# Patient Record
Sex: Female | Born: 1985 | Race: Black or African American | Hispanic: Yes | Marital: Single | Smoking: Current every day smoker
Health system: Southern US, Community
[De-identification: ages and names within clinical notes are randomized; demographics above are authoritative.]

## PROBLEM LIST (undated history)

## (undated) DIAGNOSIS — R569 Unspecified convulsions: Secondary | ICD-10-CM

## (undated) DIAGNOSIS — F4310 Post-traumatic stress disorder, unspecified: Secondary | ICD-10-CM

## (undated) DIAGNOSIS — F410 Panic disorder [episodic paroxysmal anxiety] without agoraphobia: Secondary | ICD-10-CM

## (undated) DIAGNOSIS — G40909 Epilepsy, unspecified, not intractable, without status epilepticus: Secondary | ICD-10-CM

## (undated) HISTORY — DX: Panic disorder (episodic paroxysmal anxiety): F41.0

## (undated) HISTORY — PX: OTHER SURGICAL HISTORY: SHX169

## (undated) HISTORY — PX: BIOPSY THYROID: PRO38

## (undated) HISTORY — DX: Post-traumatic stress disorder, unspecified: F43.10

---
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
## 2007-01-21 ENCOUNTER — Inpatient Hospital Stay (HOSPITAL_COMMUNITY): Admission: AD | Admit: 2007-01-21 | Discharge: 2007-01-23 | Payer: Self-pay | Admitting: Obstetrics

## 2007-12-21 ENCOUNTER — Emergency Department (HOSPITAL_COMMUNITY): Admission: EM | Admit: 2007-12-21 | Discharge: 2007-12-21 | Payer: Self-pay | Admitting: Emergency Medicine

## 2008-07-14 ENCOUNTER — Emergency Department (HOSPITAL_COMMUNITY): Admission: EM | Admit: 2008-07-14 | Discharge: 2008-07-15 | Payer: Self-pay | Admitting: Emergency Medicine

## 2008-12-23 ENCOUNTER — Emergency Department (HOSPITAL_COMMUNITY): Admission: EM | Admit: 2008-12-23 | Discharge: 2008-12-23 | Payer: Self-pay | Admitting: Emergency Medicine

## 2010-12-21 NOTE — H&P (Signed)
NAMEMATTEA, SEGER NO.:  [id number]   MEDICAL RECORD NO.:  [id number]          PATIENT TYPE:  INP   LOCATION:  9163                          FACILITY:  [facility]   PHYSICIAN:  Roseanna Rainbow, M.D.DATE OF BIRTH:  January 07, 1986   DATE OF ADMISSION:  01/21/2007  DATE OF DISCHARGE:                              HISTORY & PHYSICAL   CHIEF COMPLAINT:  The patient is an 25 year old gravida 1, para 0 with  an estimated date of confinement of June 23 with an intrauterine  pregnancy at 38+ weeks, complaining of rupture of membranes and uterine  contractions.   HISTORY OF PRESENT ILLNESS:  Please see the above.   ALLERGIES:  No known drug allergies.   PAST GYNECOLOGICAL HISTORY:  Noncontributory.   PAST MEDICAL HISTORY:  Denies.   PAST SURGICAL HISTORY:  Denies.   FAMILY HISTORY:  Noncontributory.   SOCIAL HISTORY:  No tobacco, ethanol or drug use.   OBSTETRIC RISK FACTORS:  Adolescent prenatal screen:  Hemoglobin 12.8,  hematocrit 37.8, platelets 221,000.  Blood type O positive, antibody  screen negative, sickle cell trait negative, RPR nonreactive, rubella  immune, hepatitis B surface antigen negative, HIV nonreactive, PPD  declined, Pap test abnormal.  Gonorrhea DNA probe positive.  Tests of  cure negative.  Addendum to OB risk factors:  Gonorrhea positive GC DNA  probe, chlamydia negative, quad screen negative, one hour GCT 82, GBS  negative on May 22, ultrasound on December 21 was 13 weeks 6 days.  Ultrasound February 5 at 20 weeks one day, placenta anterior.   PHYSICAL EXAMINATION:  VITAL SIGNS:  Temperature 97.9, blood pressure  107/69, pulse 70, respirations 20, fetal heart tracing reassuring,  tocodynamometer uterine contractions every 2-5 minutes, sterile vaginal  exam per the RN, gross rupture.  The cervix is 2 cm dilated, 80%  effaced.   ASSESSMENT:  Primigravida at term, early labor, fetal heart tracing  consistent with fetal well being.   PLAN:   Admission, expectant management.     Roseanna Rainbow, M.D.  Electronically Signed    LAJ/MEDQ  D:  01/21/2007  T:  01/21/2007  Job:  [id number]

## 2011-05-04 LAB — URINE MICROSCOPIC-ADD ON

## 2011-05-04 LAB — URINALYSIS, ROUTINE W REFLEX MICROSCOPIC
Bilirubin Urine: NEGATIVE
Glucose, UA: NEGATIVE
Ketones, ur: NEGATIVE
Nitrite: NEGATIVE
pH: 6

## 2011-05-04 LAB — POCT PREGNANCY, URINE: Operator id: 161631

## 2011-05-13 LAB — URINALYSIS, ROUTINE W REFLEX MICROSCOPIC
Nitrite: NEGATIVE
Protein, ur: NEGATIVE mg/dL
Urobilinogen, UA: 0.2 mg/dL (ref 0.0–1.0)

## 2011-05-13 LAB — URINE CULTURE

## 2011-05-13 LAB — URINE MICROSCOPIC-ADD ON

## 2011-05-13 LAB — POCT PREGNANCY, URINE: Preg Test, Ur: NEGATIVE

## 2011-05-13 LAB — WET PREP, GENITAL: Trich, Wet Prep: NONE SEEN

## 2011-05-25 LAB — CBC
HCT: 37.8
Hemoglobin: 10.8 — ABNORMAL LOW
Hemoglobin: 12.8
MCHC: 34
MCV: 92
MCV: 92.5
Platelets: 245
RDW: 13.3
RDW: 13.3

## 2014-11-12 ENCOUNTER — Encounter: Payer: Self-pay | Admitting: *Deleted

## 2014-12-11 ENCOUNTER — Encounter: Payer: Self-pay | Admitting: Medical

## 2017-06-06 ENCOUNTER — Encounter (HOSPITAL_COMMUNITY): Payer: Self-pay | Admitting: *Deleted

## 2017-06-06 ENCOUNTER — Emergency Department (HOSPITAL_COMMUNITY)
Admission: EM | Admit: 2017-06-06 | Discharge: 2017-06-07 | Disposition: A | Discharge status: Home / Self Care | Payer: Medicaid Other | Attending: Physician Assistant | Admitting: Physician Assistant

## 2017-06-06 DIAGNOSIS — J029 Acute pharyngitis, unspecified: Secondary | ICD-10-CM

## 2017-06-06 DIAGNOSIS — B349 Viral infection, unspecified: Secondary | ICD-10-CM | POA: Insufficient documentation

## 2017-06-06 LAB — URINALYSIS, ROUTINE W REFLEX MICROSCOPIC
Glucose, UA: NEGATIVE mg/dL
Hgb urine dipstick: NEGATIVE
KETONES UR: 15 mg/dL — AB
LEUKOCYTES UA: NEGATIVE
NITRITE: NEGATIVE
PH: 6 (ref 5.0–8.0)
PROTEIN: 100 mg/dL — AB
Specific Gravity, Urine: 1.025 (ref 1.005–1.030)

## 2017-06-06 LAB — URINALYSIS, MICROSCOPIC (REFLEX): RBC / HPF: NONE SEEN RBC/hpf (ref 0–5)

## 2017-06-06 LAB — COMPREHENSIVE METABOLIC PANEL
ALT: 19 U/L (ref 14–54)
ANION GAP: 13 (ref 5–15)
AST: 22 U/L (ref 15–41)
Albumin: 4.3 g/dL (ref 3.5–5.0)
Alkaline Phosphatase: 77 U/L (ref 38–126)
BUN: 9 mg/dL (ref 6–20)
CALCIUM: 9.6 mg/dL (ref 8.9–10.3)
CO2: 19 mmol/L — ABNORMAL LOW (ref 22–32)
Chloride: 101 mmol/L (ref 101–111)
Creatinine, Ser: 0.91 mg/dL (ref 0.44–1.00)
GFR calc non Af Amer: >60 mL/min (ref >60)
Glucose, Bld: 97 mg/dL (ref 65–99)
POTASSIUM: 3.4 mmol/L — AB (ref 3.5–5.1)
SODIUM: 133 mmol/L — AB (ref 135–145)
TOTAL PROTEIN: 7.8 g/dL (ref 6.5–8.1)
Total Bilirubin: 1 mg/dL (ref 0.3–1.2)

## 2017-06-06 LAB — CBC
HCT: 43.3 % (ref 36.0–46.0)
Hemoglobin: 15.2 g/dL — ABNORMAL HIGH (ref 12.0–15.0)
MCH: 32.9 pg (ref 26.0–34.0)
MCHC: 35.1 g/dL (ref 30.0–36.0)
MCV: 93.7 fL (ref 78.0–100.0)
PLATELETS: 235 10*3/uL (ref 150–400)
RBC: 4.62 MIL/uL (ref 3.87–5.11)
RDW: 12.5 % (ref 11.5–15.5)
WBC: 32.7 10*3/uL — AB (ref 4.0–10.5)

## 2017-06-06 LAB — LIPASE, BLOOD: Lipase: 17 U/L (ref 11–51)

## 2017-06-06 LAB — I-STAT CG4 LACTIC ACID, ED
Lactic Acid, Venous: 1.38 mmol/L (ref 0.5–1.9)
Lactic Acid, Venous: 0.78 mmol/L (ref 0.5–1.9)

## 2017-06-06 LAB — RAPID STREP SCREEN (MED CTR MEBANE ONLY): Streptococcus, Group A Screen (Direct): NEGATIVE

## 2017-06-06 LAB — MONONUCLEOSIS SCREEN: Mono Screen: NEGATIVE

## 2017-06-06 MED ORDER — SODIUM CHLORIDE 0.9 % IV BOLUS (SEPSIS)
1000.0000 mL | Freq: Once | INTRAVENOUS | Status: AC
Start: 2017-06-06 — End: 2017-06-07
  Administered 2017-06-06: 1000 mL via INTRAVENOUS

## 2017-06-06 MED ORDER — ONDANSETRON 4 MG PO TBDP
4.0000 mg | ORAL_TABLET | Freq: Once | ORAL | Status: DC
Start: 2017-06-06 — End: 2017-06-07

## 2017-06-06 MED ORDER — ONDANSETRON 4 MG PO TBDP
ORAL_TABLET | ORAL | Status: AC
  Filled 2017-06-06: qty 1

## 2017-06-06 MED ORDER — POTASSIUM CHLORIDE CRYS ER 20 MEQ PO TBCR
20.0000 meq | EXTENDED_RELEASE_TABLET | Freq: Once | ORAL | Status: AC
Start: 2017-06-06 — End: 2017-06-06
  Administered 2017-06-06: 20 meq via ORAL
  Filled 2017-06-06: qty 1

## 2017-06-06 MED ORDER — DEXAMETHASONE SODIUM PHOSPHATE 10 MG/ML IJ SOLN
10.0000 mg | Freq: Once | INTRAMUSCULAR | Status: AC
  Administered 2017-06-06: 10 mg via INTRAVENOUS
  Filled 2017-06-06: qty 1

## 2017-06-06 MED ORDER — KETOROLAC TROMETHAMINE 30 MG/ML IJ SOLN
30.0000 mg | Freq: Once | INTRAMUSCULAR | Status: AC
  Administered 2017-06-06: 30 mg via INTRAVENOUS
  Filled 2017-06-06: qty 1

## 2017-06-06 MED ORDER — ACETAMINOPHEN 325 MG PO TABS
650.0000 mg | ORAL_TABLET | Freq: Once | ORAL | Status: AC
  Administered 2017-06-06: 650 mg via ORAL
  Filled 2017-06-06: qty 2

## 2017-06-06 NOTE — ED Triage Notes (Signed)
Pt c/o sore throat onset x 3 days ago with nausea & diarrhea, pt c/o generalized body aches, pt c/o Headache & chills, pt c/o emesis with constant vomiting

## 2017-06-06 NOTE — ED Provider Notes (Signed)
[facility] EMERGENCY DEPARTMENT Provider Note   CSN: [id number] Arrival date & time: 06/06/17  1554     History   Chief Complaint Chief Complaint  Patient presents with  . Emesis  . Sore Throat    HPI Barbara Gilbert is a 31 y.o. female.  HPI   Barbara Gilbert is a 31 y.o. female, patient with no pertinent past medical history, presenting to the ED with sore throat accompanied by nausea, nonbilious, nonbloody vomiting, diarrhea, chills, and body aches for the last 3 days.  Rates her throat pain 10/10, sharp, nonradiating. Admits to poor oral intake.  Denies shortness of breath, chest pain, cough, abdominal pain, hematochezia/melena, or any other complaints.  History reviewed. No pertinent past medical history.  There are no active problems to display for this patient.   History reviewed. No pertinent surgical history.  OB History    No data available       Home Medications    Prior to Admission medications   Medication Sig Start Date End Date Taking? Authorizing Provider  ibuprofen (ADVIL,MOTRIN) 600 MG tablet Take 1 tablet (600 mg total) by mouth every 6 (six) hours as needed. 06/07/17   Joy, Shawn C, PA-C  ondansetron (ZOFRAN ODT) 4 MG disintegrating tablet Take 1 tablet (4 mg total) by mouth every 8 (eight) hours as needed for nausea or vomiting. 06/07/17   Anselm Pancoast, PA-C    Family History No family history on file.  Social History Social History  Substance Use Topics  . Smoking status: Never Smoker  . Smokeless tobacco: Never Used  . Alcohol use No     Allergies   Patient has no known allergies.   Review of Systems Review of Systems  Constitutional: Positive for chills.  HENT: Positive for congestion and sore throat. Negative for facial swelling and voice change.   Eyes: Negative for visual disturbance.  Respiratory: Positive for cough. Negative for shortness of breath.   Cardiovascular: Negative for chest pain.  Gastrointestinal:  Positive for diarrhea, nausea and vomiting. Negative for abdominal pain.  Musculoskeletal: Positive for myalgias. Negative for back pain.  Neurological: Negative for weakness and numbness.  All other systems reviewed and are negative.    Physical Exam Updated Vital Signs BP 100/83 (BP Location: Right Arm)   Pulse 94   Temp 97.9 F (36.6 C) (Oral)   Resp (!) 22   Ht 5\' 3"  (1.6 m)   Wt 54.4 kg (120 lb)   LMP 05/17/2017 (Approximate)   SpO2 100%   BMI 21.26 kg/m   Physical Exam  Constitutional: She appears well-developed and well-nourished. No distress.  HENT:  Head: Normocephalic and atraumatic.  Mouth/Throat: Uvula is midline and mucous membranes are normal. Oropharyngeal exudate, posterior oropharyngeal edema and posterior oropharyngeal erythema present.  Handles oral secretions without difficulty.  Mouth opening to least 3 finger widths.  Eyes: Conjunctivae are normal.  Neck: Normal range of motion. Neck supple. No Brudzinski's sign and no Kernig's sign noted.  Cardiovascular: Normal rate, regular rhythm, normal heart sounds and intact distal pulses.   Pulmonary/Chest: Effort normal and breath sounds normal. No respiratory distress.  Abdominal: Soft. There is no tenderness. There is no guarding.  Musculoskeletal: She exhibits no edema.  Lymphadenopathy:    She has cervical adenopathy.  Neurological: She is alert.  Skin: Skin is warm and dry. Capillary refill takes less than 2 seconds. She is not diaphoretic.  Psychiatric: She has a normal mood and affect. Her behavior is  normal.  Nursing note and vitals reviewed.    ED Treatments / Results  Labs (all labs ordered are listed, but only abnormal results are displayed) Labs Reviewed  COMPREHENSIVE METABOLIC PANEL - Abnormal; Notable for the following:       Result Value   Sodium 133 (*)    Potassium 3.4 (*)    CO2 19 (*)    All other components within normal limits  CBC - Abnormal; Notable for the following:    WBC  32.7 (*)    Hemoglobin 15.2 (*)    All other components within normal limits  URINALYSIS, ROUTINE W REFLEX MICROSCOPIC - Abnormal; Notable for the following:    APPearance HAZY (*)    Bilirubin Urine SMALL (*)    Ketones, ur 15 (*)    Protein, ur 100 (*)    All other components within normal limits  URINALYSIS, MICROSCOPIC (REFLEX) - Abnormal; Notable for the following:    Bacteria, UA MANY (*)    Squamous Epithelial / LPF 0-5 (*)    All other components within normal limits  RAPID STREP SCREEN (NOT AT [facility])  CULTURE, GROUP A STREP ([facility])  LIPASE, BLOOD  MONONUCLEOSIS SCREEN  INFLUENZA PANEL BY PCR (TYPE A & B)  I-STAT CG4 LACTIC ACID, ED  I-STAT CG4 LACTIC ACID, ED    EKG  EKG Interpretation  Date/Time:  Tuesday June 06 2017 21:14:27 EDT Ventricular Rate:  77 PR Interval:    QRS Duration: 76 QT Interval:  382 QTC Calculation: 433 R Axis:   88 Text Interpretation:  Sinus rhythm Minimal ST depression, inferior leads Confirmed by Donnetta Hutchingook, Brian ([id number]) on 06/07/2017 9:03:56 PM       Radiology No results found.  Procedures Procedures (including critical care time)  Medications Ordered in ED Medications  sodium chloride 0.9 % bolus 1,000 mL (0 mLs Intravenous Stopped 06/07/17 0000)  ketorolac (TORADOL) 30 MG/ML injection 30 mg (30 mg Intravenous Given 06/06/17 2121)  dexamethasone (DECADRON) injection 10 mg (10 mg Intravenous Given 06/06/17 2121)  acetaminophen (TYLENOL) tablet 650 mg (650 mg Oral Given 06/06/17 2135)  potassium chloride SA (K-DUR,KLOR-CON) CR tablet 20 mEq (20 mEq Oral Given 06/06/17 2303)     Initial Impression / Assessment and Plan / ED Course  I have reviewed the triage vital signs and the nursing notes.  Pertinent labs & imaging results that were available during my care of the patient were reviewed by me and considered in my medical decision making (see chart for details).  Clinical Course as of Jun 08 218  Tue Jun 06, 2017  2245 Patient  states she feels much better. Throat pain is now minor.   [SJ]    Clinical Course User Index [SJ] Joy, Shawn C, PA-C    Patient presents with sore throat, nausea/vomiting, diarrhea, and body aches.  Symptoms consistent with viral syndrome.  Significant leukocytosis noted and mononucleosis is high on the differential.  Negative Monospot may be due to patient being early in the course. Patient is nontoxic appearing, not tachycardic or tachypneic on my exam, not hypotensive, maintains SPO2 of 100% on room air, and is in no apparent distress. Patient has no signs of sepsis or other serious or life-threatening condition. The patient was given instructions for home care as well as return precautions. Patient voices understanding of these instructions, accepts the plan, and is comfortable with discharge.   Findings and plan of care discussed with Bary Castillaourteney Mackuen, MD.   Vitals:   06/06/17 1949 06/06/17  2117 06/06/17 2243 06/07/17 0026  BP: 100/83   119/74  Pulse: 94   (!) 102  Resp: (!) 22   20  Temp:  (!) 101.6 F (38.7 C) 98.9 F (37.2 C)   TempSrc:  Rectal Oral   SpO2: 100%   100%  Weight:      Height:         Final Clinical Impressions(s) / ED Diagnoses   Final diagnoses:  Sore throat  Viral syndrome    New Prescriptions Discharge Medication List as of 06/07/2017 12:05 AM    START taking these medications   Details  ibuprofen (ADVIL,MOTRIN) 600 MG tablet Take 1 tablet (600 mg total) by mouth every 6 (six) hours as needed., Starting Wed 06/07/2017, Print    ondansetron (ZOFRAN ODT) 4 MG disintegrating tablet Take 1 tablet (4 mg total) by mouth every 8 (eight) hours as needed for nausea or vomiting., Starting Wed 06/07/2017, Print         Joy, Weston C, PA-C 06/08/17 0223    Abelino Derrick, MD 06/08/17 1745

## 2017-06-07 LAB — INFLUENZA PANEL BY PCR (TYPE A & B)
INFLAPCR: NEGATIVE
Influenza B By PCR: NEGATIVE

## 2017-06-07 MED ORDER — ONDANSETRON 4 MG PO TBDP: 4.0000 mg | ORAL_TABLET | Freq: Three times a day (TID) | ORAL | 0 refills | Status: DC | PRN

## 2017-06-07 MED ORDER — IBUPROFEN 600 MG PO TABS: 600.0000 mg | ORAL_TABLET | Freq: Four times a day (QID) | ORAL | 0 refills | Status: DC | PRN

## 2017-06-07 NOTE — Discharge Instructions (Signed)
Your symptoms are consistent with a viral illness. Viruses do not require antibiotics. Treatment is symptomatic care and it is important to note that these symptoms may last for 7-14 days.  Your strep test was negative. Your mono test was also negative, however, there is a chance of a false negative. Mono can cause swelling to the spleen which is in the left side of the abdomen.  Please refrain from any contact sports or activities that could cause trauma or hits to the abdomen.  Hand washing: Wash your hands throughout the day, but especially before and after touching the face, using the restroom, sneezing, coughing, or touching surfaces that have been coughed or sneezed upon. Hydration: Symptoms will be intensified and complicated by dehydration. Dehydration can also extend the duration of symptoms. Drink plenty of fluids and get plenty of rest. You should be drinking at least half a liter of water an hour to stay hydrated. Electrolyte drinks are also encouraged. You should be drinking enough fluids to make your urine light yellow, almost clear. If this is not the case, you are not drinking enough water. Please note that some of the treatments indicated below will not be effective if you are not adequately hydrated. Pain or fever: Ibuprofen, Naproxen, or Tylenol for pain or fever.  Nausea/vomiting: Use the Zofran for nausea or vomiting.  Congestion: Plain Mucinex may help relieve congestion. Saline sinus rinses and saline nasal sprays may also help relieve congestion. If you do not have heart problems or an allergy to such medications, you may also try phenylephrine or Sudafed. Sore throat: Warm liquids or Chloraseptic spray may help soothe a sore throat. Gargle twice a day with a salt water solution made from a half teaspoon of salt in a cup of warm water.  Follow up: Follow up with a primary care provider, as needed, for any future management of this issue. Return: Return to the ED for worsening  symptoms, continuous vomiting, severe abdominal pain, or any other major concerns.

## 2017-06-09 LAB — CULTURE, GROUP A STREP (THRC)

## 2017-06-22 ENCOUNTER — Ambulatory Visit: Payer: Self-pay | Admitting: Obstetrics and Gynecology

## 2017-07-03 ENCOUNTER — Ambulatory Visit (INDEPENDENT_AMBULATORY_CARE_PROVIDER_SITE_OTHER): Payer: Medicaid Other | Admitting: Obstetrics

## 2017-07-03 ENCOUNTER — Other Ambulatory Visit (HOSPITAL_COMMUNITY)
Admission: RE | Admit: 2017-07-03 | Discharge: 2017-07-03 | Disposition: A | Discharge status: Home / Self Care | Payer: Medicaid Other | Source: Ambulatory Visit | Attending: Obstetrics | Admitting: Obstetrics

## 2017-07-03 ENCOUNTER — Encounter: Payer: Self-pay | Admitting: Obstetrics

## 2017-07-03 VITALS — BP 110/67 | HR 79 | Ht 63.0 in | Wt 120.8 lb

## 2017-07-03 DIAGNOSIS — N898 Other specified noninflammatory disorders of vagina: Secondary | ICD-10-CM | POA: Diagnosis not present

## 2017-07-03 DIAGNOSIS — Z01419 Encounter for gynecological examination (general) (routine) without abnormal findings: Secondary | ICD-10-CM | POA: Diagnosis not present

## 2017-07-03 DIAGNOSIS — Z113 Encounter for screening for infections with a predominantly sexual mode of transmission: Secondary | ICD-10-CM

## 2017-07-03 DIAGNOSIS — Z3009 Encounter for other general counseling and advice on contraception: Secondary | ICD-10-CM

## 2017-07-03 DIAGNOSIS — Z0000 Encounter for general adult medical examination without abnormal findings: Secondary | ICD-10-CM | POA: Diagnosis not present

## 2017-07-03 NOTE — Progress Notes (Signed)
Subjective:        Barbara Gilbert is a 31 y.o. female here for a routine exam.  Current complaints: None.    Personal health questionnaire:  Is patient Barbara Gilbert, have a family history of breast and/or ovarian cancer: no Is there a family history of uterine cancer diagnosed at age < 2950, gastrointestinal cancer, urinary tract cancer, family member who is a [name]: no Is the patient overweight and hypertensive, family history of diabetes, personal history of gestational diabetes, preeclampsia or PCOS: no Is patient over 6655, have PCOS,  family history of premature CHD under age 29, diabetes, smoke, have hypertension or peripheral artery disease:  no At any time, has a partner hit, kicked or otherwise hurt or frightened you?: no Over the past 2 weeks, have you felt down, depressed or hopeless?: no Over the past 2 weeks, have you felt little interest or pleasure in doing things?:no   Gynecologic History Patient's last menstrual period was 06/09/2017. Contraception: none Last Pap: 2016. Results were: normal Last mammogram: n/a. Results were: n/a  Obstetric History OB History  Gravida Para Term Preterm AB Living  3 1 1   2 1   SAB TAB Ectopic Multiple Live Births    2     1    # Outcome Date GA Lbr Len/2nd Weight Sex Delivery Anes PTL Lv  3 Term 01/21/07    M Vag-Spont   LIV  2 TAB           1 TAB               History reviewed. No pertinent past medical history.  History reviewed. No pertinent surgical history.   Current Outpatient Medications:  .  clonazePAM (KLONOPIN) 1 MG tablet, Take 1 mg by mouth 2 (two) times daily as needed for anxiety., Disp: , Rfl:  .  ibuprofen (ADVIL,MOTRIN) 600 MG tablet, Take 1 tablet (600 mg total) by mouth every 6 (six) hours as needed. (Patient not taking: Reported on 07/03/2017), Disp: 30 tablet, Rfl: 0 .  ondansetron (ZOFRAN ODT) 4 MG disintegrating tablet, Take 1 tablet (4 mg total) by mouth every 8 (eight) hours  as needed for nausea or vomiting. (Patient not taking: Reported on 07/03/2017), Disp: 20 tablet, Rfl: 0 No Known Allergies  Social History   Tobacco Use  . Smoking status: Current Every Day Smoker    Packs/day: 0.30    Start date: 2009  . Smokeless tobacco: Never Used  Substance Use Topics  . Alcohol use: No    History reviewed. No pertinent family history.    Review of Systems  Constitutional: negative for fatigue and weight loss Respiratory: negative for cough and wheezing Cardiovascular: negative for chest pain, fatigue and palpitations Gastrointestinal: negative for abdominal pain and change in bowel habits Musculoskeletal:negative for myalgias Neurological: negative for gait problems and tremors Behavioral/Psych: negative for abusive relationship, depression Endocrine: negative for temperature intolerance    Genitourinary:negative for abnormal menstrual periods, genital lesions, hot flashes, sexual problems and vaginal discharge Integument/breast: negative for breast lump, breast tenderness, nipple discharge and skin lesion(s)    Objective:       BP 110/67   Pulse 79   Ht 5\' 3"  (1.6 m)   Wt 120 lb 12.8 oz (54.8 kg)   LMP 06/09/2017   BMI 21.40 kg/m  General:   alert  Skin:   no rash or abnormalities  Lungs:   clear to auscultation bilaterally  Heart:   regular rate  and rhythm, S1, S2 normal, no murmur, click, rub or gallop  Breasts:   normal without suspicious masses, skin or nipple changes or axillary nodes  Abdomen:  normal findings: no organomegaly, soft, non-tender and no hernia  Pelvis:  External genitalia: normal general appearance Urinary system: urethral meatus normal and bladder without fullness, nontender Vaginal: normal without tenderness, induration or masses Cervix: normal appearance Adnexa: normal bimanual exam Uterus: anteverted and non-tender, normal size   Lab Review Urine pregnancy test Labs reviewed yes Radiologic studies reviewed  no  50% of 20 min visit spent on counseling and coordination of care.    Assessment and Plan:     1. Encounter for routine gynecological examination with Papanicolaou smear of cervix Rx: - Cytology - PAP  2. Vaginal discharge Rx: - Cervicovaginal ancillary only  3. Encounter for other general counseling and advice on contraception - wants Laparoscopic Tubal - tubal papers signed today  4. Screening examination for STD (sexually transmitted disease) Rx: - HIV antibody - Hepatitis B surface antigen - RPR - Hepatitis C antibody   Plan:    Education reviewed: calcium supplements, depression evaluation, low fat, low cholesterol diet, safe sex/STD prevention, self breast exams, smoking cessation and weight bearing exercise. Contraception: tubal ligation. Follow up in: 4 weeks. Pre-op visit for tubal.  No orders of the defined types were placed in this encounter.  No orders of the defined types were placed in this encounter.

## 2017-07-03 NOTE — Progress Notes (Signed)
Patient is in the office as new GYN, wants to discuss tubal and requests std testing.

## 2017-07-03 NOTE — Patient Instructions (Addendum)
Laparoscopic Tubal Ligation Laparoscopic tubal ligation is a procedure to close the fallopian tubes. This is done so that you cannot get pregnant. When the fallopian tubes are closed, the eggs that your ovaries release cannot enter the uterus, and sperm cannot reach the released eggs. A laparoscopic tubal ligation is sometimes called "getting your tubes tied." You should not have this procedure if you want to get pregnant someday or if you are unsure about having more children. Tell a health care provider about:  Any allergies you have.  All medicines you are taking, including vitamins, herbs, eye drops, creams, and over-the-counter medicines.  Any problems you or family members have had with anesthetic medicines.  Any blood disorders you have.  Any surgeries you have had.  Any medical conditions you have.  Whether you are pregnant or may be pregnant.  Any past pregnancies. What are the risks? Generally, this is a safe procedure. However, problems may occur, including:  Infection.  Bleeding.  Injury to surrounding organs.  Side effects from anesthetics.  Failure of the procedure.  This procedure can increase your risk of a kind of pregnancy in which a fertilized egg attaches to the outside of the uterus (ectopic pregnancy). What happens before the procedure?  Ask your health care provider about: ? Changing or stopping your regular medicines. This is especially important if you are taking diabetes medicines or blood thinners. ? Taking medicines such as aspirin and ibuprofen. These medicines can thin your blood. Do not take these medicines before your procedure if your health care provider instructs you not to.  Follow instructions from your health care provider about eating and drinking restrictions.  Plan to have someone take you home after the procedure.  If you go home right after the procedure, plan to have someone with you for 24 hours. What happens during the  procedure?  You will be given one or more of the following: ? A medicine to help you relax (sedative). ? A medicine to numb the area (local anesthetic). ? A medicine to make you fall asleep (general anesthetic). ? A medicine that is injected into an area of your body to numb everything below the injection site (regional anesthetic).  An IV tube will be inserted into one of your veins. It will be used to give you medicines and fluids during the procedure.  Your bladder may be emptied with a small tube (catheter).  If you have been given a general anesthetic, a tube will be put down your throat to help you breathe.  Two small cuts (incisions) will be made in your lower abdomen and near your belly button.  Your abdomen will be inflated with a gas. This will let the surgeon see better and will give the surgeon room to work.  A thin, lighted tube (laparoscope) with a camera attached will be inserted into your abdomen through one of the incisions. Small instruments will be inserted through the other incision.  The fallopian tubes will be tied off, burned (cauterized), or blocked with a clip, ring, or clamp. A small portion in the center of each fallopian tube may be removed.  The gas will be released from the abdomen.  The incisions will be closed with stitches (sutures).  A bandage (dressing) will be placed over the incisions. The procedure may vary among health care providers and hospitals. What happens after the procedure?  Your blood pressure, heart rate, breathing rate, and blood oxygen level will be monitored often until the medicines you   were given have worn off.  You will be given medicine to help with pain, nausea, and vomiting as needed. This information is not intended to replace advice given to you by your health care provider. Make sure you discuss any questions you have with your health care provider. Document Released: 10/31/2000 Document Revised: 12/31/2015 Document  Reviewed: 07/05/2015 Elsevier Interactive Patient Education  2018 [employer]  Laparoscopic Tubal Ligation, Care After Refer to this sheet in the next few weeks. These instructions provide you with information about caring for yourself after your procedure. Your health care provider may also give you more specific instructions. Your treatment has been planned according to current medical practices, but problems sometimes occur. Call your health care provider if you have any problems or questions after your procedure. What can I expect after the procedure? After the procedure, it is common to have:  A sore throat.  Discomfort in your shoulder.  Mild discomfort or cramping in your abdomen.  Gas pains.  Pain or soreness in the area where the surgical cut (incision) was made.  A bloated feeling.  Tiredness.  Nausea.  Vomiting.  Follow these instructions at home: Medicines  Take over-the-counter and prescription medicines only as told by your health care provider.  Do not take aspirin because it can cause bleeding.  Do not drive or operate heavy machinery while taking prescription pain medicine. Activity  Rest for the rest of the day.  Return to your normal activities as told by your health care provider. Ask your health care provider what activities are safe for you. Incision care   Follow instructions from your health care provider about how to take care of your incision. Make sure you: ? Wash your hands with soap and water before you change your bandage (dressing). If soap and water are not available, use hand sanitizer. ? Change your dressing as told by your health care provider. ? Leave stitches (sutures) in place. They may need to stay in place for 2 weeks or longer.  Check your incision area every day for signs of infection. Check for: ? More redness, swelling, or pain. ? More fluid or blood. ? Warmth. ? Pus or a bad smell. Other Instructions  Do not take  baths, swim, or use a hot tub until your health care provider approves. You may take showers.  Keep all follow-up visits as told by your health care provider. This is important.  Have someone help you with your daily household tasks for the first few days. Contact a health care provider if:  You have more redness, swelling, or pain around your incision.  Your incision feels warm to the touch.  You have pus or a bad smell coming from your incision.  The edges of your incision break open after the sutures have been removed.  Your pain does not improve after 2-3 days.  You have a rash.  You repeatedly become dizzy or light-headed.  Your pain medicine is not helping.  You are constipated. Get help right away if:  You have a fever.  You faint.  You have increasing pain in your abdomen.  You have severe pain in one or both of your shoulders.  You have fluid or blood coming from your sutures or from your vagina.  You have shortness of breath or difficulty breathing.  You have chest pain or leg pain.  You have ongoing nausea, vomiting, or diarrhea. This information is not intended to replace advice given to you by your   health care provider. Make sure you discuss any questions you have with your health care provider. Document Released: 02/11/2005 Document Revised: 12/28/2015 Document Reviewed: 07/05/2015 Elsevier Interactive Patient Education  2018 [employer]  

## 2017-07-04 ENCOUNTER — Other Ambulatory Visit: Payer: Self-pay | Admitting: Obstetrics

## 2017-07-04 DIAGNOSIS — B9689 Other specified bacterial agents as the cause of diseases classified elsewhere: Secondary | ICD-10-CM

## 2017-07-04 DIAGNOSIS — N760 Acute vaginitis: Principal | ICD-10-CM

## 2017-07-04 DIAGNOSIS — A5901 Trichomonal vulvovaginitis: Secondary | ICD-10-CM

## 2017-07-04 LAB — CERVICOVAGINAL ANCILLARY ONLY
BACTERIAL VAGINITIS: POSITIVE — AB
CANDIDA VAGINITIS: NEGATIVE
Chlamydia: NEGATIVE
Neisseria Gonorrhea: NEGATIVE
TRICH (WINDOWPATH): POSITIVE — AB

## 2017-07-04 LAB — HEPATITIS C ANTIBODY

## 2017-07-04 LAB — CYTOLOGY - PAP: Diagnosis: NEGATIVE

## 2017-07-04 LAB — HEPATITIS B SURFACE ANTIGEN: HEP B S AG: NEGATIVE

## 2017-07-04 LAB — RPR: RPR: NONREACTIVE

## 2017-07-04 LAB — HIV ANTIBODY (ROUTINE TESTING W REFLEX): HIV SCREEN 4TH GENERATION: NONREACTIVE

## 2017-07-04 MED ORDER — TINIDAZOLE 500 MG PO TABS: 2.0000 g | ORAL_TABLET | Freq: Every day | ORAL | 0 refills | Status: DC

## 2017-07-05 ENCOUNTER — Telehealth: Payer: Self-pay

## 2017-07-05 ENCOUNTER — Other Ambulatory Visit: Payer: Self-pay

## 2017-07-05 NOTE — Telephone Encounter (Signed)
Patient notified of results and RX 

## 2017-07-05 NOTE — Telephone Encounter (Signed)
-----   Message from Brock Badharles A Harper, MD sent at 07/04/2017  5:03 PM EST ----- Tinidazole Rx for Trichomonas and BV.

## 2017-07-05 NOTE — Telephone Encounter (Signed)
Patient that PA was completed and faxed to her pharmacy.

## 2017-08-02 ENCOUNTER — Ambulatory Visit (INDEPENDENT_AMBULATORY_CARE_PROVIDER_SITE_OTHER): Payer: Medicaid Other | Admitting: Obstetrics & Gynecology

## 2017-08-02 ENCOUNTER — Encounter: Payer: Self-pay | Admitting: Obstetrics & Gynecology

## 2017-08-02 DIAGNOSIS — Z3009 Encounter for other general counseling and advice on contraception: Secondary | ICD-10-CM | POA: Diagnosis not present

## 2017-08-02 NOTE — Patient Instructions (Signed)
Laparoscopic Tubal Ligation Laparoscopic tubal ligation is a procedure to close the fallopian tubes. This is done so that you cannot get pregnant. When the fallopian tubes are closed, the eggs that your ovaries release cannot enter the uterus, and sperm cannot reach the released eggs. A laparoscopic tubal ligation is sometimes called "getting your tubes tied." You should not have this procedure if you want to get pregnant someday or if you are unsure about having more children. Tell a health care provider about:  Any allergies you have.  All medicines you are taking, including vitamins, herbs, eye drops, creams, and over-the-counter medicines.  Any problems you or family members have had with anesthetic medicines.  Any blood disorders you have.  Any surgeries you have had.  Any medical conditions you have.  Whether you are pregnant or may be pregnant.  Any past pregnancies. What are the risks? Generally, this is a safe procedure. However, problems may occur, including:  Infection.  Bleeding.  Injury to surrounding organs.  Side effects from anesthetics.  Failure of the procedure.  This procedure can increase your risk of a kind of pregnancy in which a fertilized egg attaches to the outside of the uterus (ectopic pregnancy). What happens before the procedure?  Ask your health care provider about: ? Changing or stopping your regular medicines. This is especially important if you are taking diabetes medicines or blood thinners. ? Taking medicines such as aspirin and ibuprofen. These medicines can thin your blood. Do not take these medicines before your procedure if your health care provider instructs you not to.  Follow instructions from your health care provider about eating and drinking restrictions.  Plan to have someone take you home after the procedure.  If you go home right after the procedure, plan to have someone with you for 24 hours. What happens during the  procedure?  You will be given one or more of the following: ? A medicine to help you relax (sedative). ? A medicine to numb the area (local anesthetic). ? A medicine to make you fall asleep (general anesthetic). ? A medicine that is injected into an area of your body to numb everything below the injection site (regional anesthetic).  An IV tube will be inserted into one of your veins. It will be used to give you medicines and fluids during the procedure.  Your bladder may be emptied with a small tube (catheter).  If you have been given a general anesthetic, a tube will be put down your throat to help you breathe.  Two small cuts (incisions) will be made in your lower abdomen and near your belly button.  Your abdomen will be inflated with a gas. This will let the surgeon see better and will give the surgeon room to work.  A thin, lighted tube (laparoscope) with a camera attached will be inserted into your abdomen through one of the incisions. Small instruments will be inserted through the other incision.  The fallopian tubes will be tied off, burned (cauterized), or blocked with a clip, ring, or clamp. A small portion in the center of each fallopian tube may be removed.  The gas will be released from the abdomen.  The incisions will be closed with stitches (sutures).  A bandage (dressing) will be placed over the incisions. The procedure may vary among health care providers and hospitals. What happens after the procedure?  Your blood pressure, heart rate, breathing rate, and blood oxygen level will be monitored often until the medicines you   were given have worn off.  You will be given medicine to help with pain, nausea, and vomiting as needed. This information is not intended to replace advice given to you by your health care provider. Make sure you discuss any questions you have with your health care provider. Document Released: 10/31/2000 Document Revised: 12/31/2015 Document  Reviewed: 07/05/2015 Elsevier Interactive Patient Education  2018 [employer] Inc.  

## 2017-08-02 NOTE — Progress Notes (Signed)
Patient desires surgical management with LBTL.   31 y.o. [id number] with undesired fertility desires permanent sterilization. Risks and benefits of laparoscopic tubal sterilization procedure was discussed with the patient including permanence of method, bleeding, infection, injury to surrounding organs, anesthesia and need for additional procedures. Risk failure of 0.5-1% with increased risk of ectopic gestation if pregnancy occurs was also discussed with patient. Patient verbalized understanding and all questions were answered. She declines reversible methods of contraception and accepts the use of Filshie clips.   The risks of surgery were discussed in detail with the patient including but not limited to: bleeding which may require transfusion or reoperation; infection which may require prolonged hospitalization or re-hospitalization and antibiotic therapy; injury to bowel, bladder, ureters and major vessels or other surrounding organs; need for additional procedures including laparotomy; thromboembolic phenomenon, incisional problems and other postoperative or anesthesia complications.  Patient was told that the likelihood that her condition and symptoms will be treated effectively with this surgical management was very high; the postoperative expectations were also discussed in detail. The patient also understands the alternative treatment options which were discussed in full. All questions were answered.  She was told that she will be contacted by our surgical scheduler regarding the time and date of her surgery; routine preoperative instructions of having nothing to eat or drink after midnight on the day prior to surgery and also coming to the hospital 1.5 hours prior to her time of surgery were also emphasized.  She was told she may be called for a preoperative appointment about a week prior to surgery and will be given further preoperative instructions at that visit. Printed patient education handouts about  the procedure were given to the patient to review at home.   Adam PhenixArnold, James G, MD

## 2017-08-03 ENCOUNTER — Encounter (HOSPITAL_COMMUNITY): Payer: Self-pay

## 2017-09-21 ENCOUNTER — Other Ambulatory Visit: Payer: Self-pay | Admitting: Obstetrics & Gynecology

## 2017-09-26 ENCOUNTER — Encounter (HOSPITAL_BASED_OUTPATIENT_CLINIC_OR_DEPARTMENT_OTHER): Payer: Self-pay | Admitting: *Deleted

## 2017-09-26 ENCOUNTER — Other Ambulatory Visit: Payer: Self-pay

## 2017-10-03 ENCOUNTER — Encounter (HOSPITAL_BASED_OUTPATIENT_CLINIC_OR_DEPARTMENT_OTHER)
Admission: RE | Admit: 2017-10-03 | Discharge: 2017-10-03 | Disposition: A | Discharge status: Home / Self Care | Payer: Medicaid Other | Source: Ambulatory Visit | Attending: Obstetrics & Gynecology | Admitting: Obstetrics & Gynecology

## 2017-10-03 DIAGNOSIS — Z01818 Encounter for other preprocedural examination: Secondary | ICD-10-CM | POA: Diagnosis not present

## 2017-10-03 LAB — POCT PREGNANCY, URINE: Preg Test, Ur: NEGATIVE

## 2017-10-03 NOTE — Progress Notes (Signed)
Urine pregnancy test negative

## 2017-10-04 ENCOUNTER — Encounter (HOSPITAL_COMMUNITY): Payer: Self-pay

## 2017-10-04 ENCOUNTER — Encounter (HOSPITAL_BASED_OUTPATIENT_CLINIC_OR_DEPARTMENT_OTHER)
Admission: RE | Disposition: A | Discharge status: Home / Self Care | Payer: Self-pay | Source: Ambulatory Visit | Attending: Obstetrics & Gynecology

## 2017-10-04 ENCOUNTER — Encounter (HOSPITAL_BASED_OUTPATIENT_CLINIC_OR_DEPARTMENT_OTHER): Payer: Self-pay | Admitting: Certified Registered"

## 2017-10-04 ENCOUNTER — Ambulatory Visit (HOSPITAL_BASED_OUTPATIENT_CLINIC_OR_DEPARTMENT_OTHER)
Admission: RE | Admit: 2017-10-04 | Discharge: 2017-10-04 | Disposition: A | Discharge status: Home / Self Care | Payer: Medicaid Other | Source: Ambulatory Visit | Attending: Obstetrics & Gynecology | Admitting: Obstetrics & Gynecology

## 2017-10-04 ENCOUNTER — Ambulatory Visit (HOSPITAL_BASED_OUTPATIENT_CLINIC_OR_DEPARTMENT_OTHER): Payer: Medicaid Other | Admitting: Certified Registered"

## 2017-10-04 DIAGNOSIS — Z79899 Other long term (current) drug therapy: Secondary | ICD-10-CM | POA: Diagnosis not present

## 2017-10-04 DIAGNOSIS — F4310 Post-traumatic stress disorder, unspecified: Secondary | ICD-10-CM | POA: Insufficient documentation

## 2017-10-04 DIAGNOSIS — Z302 Encounter for sterilization: Secondary | ICD-10-CM

## 2017-10-04 DIAGNOSIS — F410 Panic disorder [episodic paroxysmal anxiety] without agoraphobia: Secondary | ICD-10-CM | POA: Insufficient documentation

## 2017-10-04 DIAGNOSIS — F17210 Nicotine dependence, cigarettes, uncomplicated: Secondary | ICD-10-CM | POA: Diagnosis not present

## 2017-10-04 HISTORY — PX: LAPAROSCOPIC TUBAL LIGATION: SHX1937

## 2017-10-04 SURGERY — LIGATION, FALLOPIAN TUBE, LAPAROSCOPIC
Anesthesia: General | Site: Abdomen | Laterality: Bilateral

## 2017-10-04 MED ORDER — HYDROMORPHONE HCL 1 MG/ML IJ SOLN
INTRAMUSCULAR | Status: AC
  Filled 2017-10-04: qty 0.5

## 2017-10-04 MED ORDER — SCOPOLAMINE 1 MG/3DAYS TD PT72
MEDICATED_PATCH | TRANSDERMAL | Status: AC
  Filled 2017-10-04: qty 1

## 2017-10-04 MED ORDER — DEXAMETHASONE SODIUM PHOSPHATE 10 MG/ML IJ SOLN
INTRAMUSCULAR | Status: AC
  Filled 2017-10-04: qty 1

## 2017-10-04 MED ORDER — ACETAMINOPHEN 500 MG PO TABS
1000.0000 mg | ORAL_TABLET | Freq: Once | ORAL | Status: AC
  Administered 2017-10-04: 1000 mg via ORAL

## 2017-10-04 MED ORDER — LIDOCAINE HCL (CARDIAC) 20 MG/ML IV SOLN
INTRAVENOUS | Status: DC | PRN
  Administered 2017-10-04: 60 mg via INTRAVENOUS

## 2017-10-04 MED ORDER — ROCURONIUM BROMIDE 100 MG/10ML IV SOLN
INTRAVENOUS | Status: DC | PRN
  Administered 2017-10-04: 60 mg via INTRAVENOUS

## 2017-10-04 MED ORDER — FENTANYL CITRATE (PF) 100 MCG/2ML IJ SOLN
50.0000 ug | INTRAMUSCULAR | Status: DC | PRN
  Administered 2017-10-04: 100 ug via INTRAVENOUS

## 2017-10-04 MED ORDER — OXYCODONE HCL 5 MG PO TABS: 5.0000 mg | ORAL_TABLET | Freq: Once | ORAL | Status: DC | PRN

## 2017-10-04 MED ORDER — PROMETHAZINE HCL 25 MG/ML IJ SOLN
INTRAMUSCULAR | Status: AC
  Filled 2017-10-04: qty 1

## 2017-10-04 MED ORDER — ONDANSETRON HCL 4 MG/2ML IJ SOLN
INTRAMUSCULAR | Status: DC | PRN
  Administered 2017-10-04 (×2): 2 mg via INTRAVENOUS

## 2017-10-04 MED ORDER — ROCURONIUM BROMIDE 10 MG/ML (PF) SYRINGE
PREFILLED_SYRINGE | INTRAVENOUS | Status: AC
  Filled 2017-10-04: qty 5

## 2017-10-04 MED ORDER — SCOPOLAMINE 1 MG/3DAYS TD PT72
1.0000 | MEDICATED_PATCH | Freq: Once | TRANSDERMAL | Status: DC | PRN
Start: 2017-10-04 — End: 2017-10-04

## 2017-10-04 MED ORDER — BUPIVACAINE HCL (PF) 0.25 % IJ SOLN
INTRAMUSCULAR | Status: DC | PRN
  Administered 2017-10-04: 5 mL

## 2017-10-04 MED ORDER — NEOSTIGMINE METHYLSULFATE 5 MG/5ML IV SOSY
PREFILLED_SYRINGE | INTRAVENOUS | Status: AC
  Filled 2017-10-04: qty 5

## 2017-10-04 MED ORDER — OXYCODONE HCL 5 MG PO TABS: 5.0000 mg | ORAL_TABLET | ORAL | 0 refills | Status: DC | PRN

## 2017-10-04 MED ORDER — ACETAMINOPHEN 500 MG PO TABS
ORAL_TABLET | ORAL | Status: AC
  Filled 2017-10-04: qty 2

## 2017-10-04 MED ORDER — HYDROMORPHONE HCL 1 MG/ML IJ SOLN
0.2500 mg | INTRAMUSCULAR | Status: DC | PRN
  Administered 2017-10-04: 0.5 mg via INTRAVENOUS

## 2017-10-04 MED ORDER — ONDANSETRON HCL 4 MG/2ML IJ SOLN
INTRAMUSCULAR | Status: AC
  Filled 2017-10-04: qty 2

## 2017-10-04 MED ORDER — PROPOFOL 10 MG/ML IV BOLUS
INTRAVENOUS | Status: DC | PRN
  Administered 2017-10-04: 150 mg via INTRAVENOUS

## 2017-10-04 MED ORDER — BUPIVACAINE HCL (PF) 0.25 % IJ SOLN
INTRAMUSCULAR | Status: AC
  Filled 2017-10-04: qty 30

## 2017-10-04 MED ORDER — KETOROLAC TROMETHAMINE 30 MG/ML IJ SOLN
INTRAMUSCULAR | Status: DC | PRN
  Administered 2017-10-04: 30 mg via INTRAVENOUS

## 2017-10-04 MED ORDER — OXYCODONE HCL 5 MG/5ML PO SOLN: 5.0000 mg | Freq: Once | ORAL | Status: DC | PRN

## 2017-10-04 MED ORDER — MIDAZOLAM HCL 2 MG/2ML IJ SOLN
INTRAMUSCULAR | Status: AC
  Filled 2017-10-04: qty 2

## 2017-10-04 MED ORDER — LACTATED RINGERS IV SOLN
INTRAVENOUS | Status: DC
  Administered 2017-10-04 (×2): via INTRAVENOUS

## 2017-10-04 MED ORDER — LACTATED RINGERS IV SOLN: INTRAVENOUS | Status: DC

## 2017-10-04 MED ORDER — PROPOFOL 10 MG/ML IV BOLUS
INTRAVENOUS | Status: AC
  Filled 2017-10-04: qty 40

## 2017-10-04 MED ORDER — LIDOCAINE 2% (20 MG/ML) 5 ML SYRINGE
INTRAMUSCULAR | Status: AC
  Filled 2017-10-04: qty 5

## 2017-10-04 MED ORDER — FENTANYL CITRATE (PF) 100 MCG/2ML IJ SOLN
INTRAMUSCULAR | Status: AC
  Filled 2017-10-04: qty 2

## 2017-10-04 MED ORDER — ARTIFICIAL TEARS OPHTHALMIC OINT
TOPICAL_OINTMENT | OPHTHALMIC | Status: DC | PRN
  Administered 2017-10-04: 1 via OPHTHALMIC

## 2017-10-04 MED ORDER — DEXAMETHASONE SODIUM PHOSPHATE 4 MG/ML IJ SOLN
INTRAMUSCULAR | Status: DC | PRN
  Administered 2017-10-04: 10 mg via INTRAVENOUS

## 2017-10-04 MED ORDER — MIDAZOLAM HCL 2 MG/2ML IJ SOLN
1.0000 mg | INTRAMUSCULAR | Status: DC | PRN
  Administered 2017-10-04: 2 mg via INTRAVENOUS

## 2017-10-04 MED ORDER — PROMETHAZINE HCL 25 MG/ML IJ SOLN
6.2500 mg | INTRAMUSCULAR | Status: DC | PRN
  Administered 2017-10-04: 6.25 mg via INTRAVENOUS

## 2017-10-04 MED ORDER — SUGAMMADEX SODIUM 200 MG/2ML IV SOLN
INTRAVENOUS | Status: DC | PRN
Start: 2017-10-04 — End: 2017-10-04
  Administered 2017-10-04: 200 mg via INTRAVENOUS

## 2017-10-04 SURGICAL SUPPLY — 29 items
BRIEF STRETCH FOR OB PAD XXL (UNDERPADS AND DIAPERS) ×3 IMPLANT
CATH ROBINSON RED A/P 16FR (CATHETERS) IMPLANT
CLIP FILSHIE TUBAL LIGA STRL (Clip) ×3 IMPLANT
CLOTH BEACON ORANGE TIMEOUT ST (SAFETY) IMPLANT
DERMABOND ADVANCED (GAUZE/BANDAGES/DRESSINGS)
DERMABOND ADVANCED .7 DNX12 (GAUZE/BANDAGES/DRESSINGS) IMPLANT
DRSG OPSITE POSTOP 3X4 (GAUZE/BANDAGES/DRESSINGS) IMPLANT
DURAPREP 26ML APPLICATOR (WOUND CARE) ×3 IMPLANT
GLOVE BIO SURGEON STRL SZ 6.5 (GLOVE) ×2 IMPLANT
GLOVE BIO SURGEONS STRL SZ 6.5 (GLOVE) ×1
GLOVE BIOGEL PI IND STRL 7.0 (GLOVE) ×4 IMPLANT
GLOVE BIOGEL PI INDICATOR 7.0 (GLOVE) ×8
GOWN STRL REUS W/TWL LRG LVL3 (GOWN DISPOSABLE) ×6 IMPLANT
NEEDLE INSUFFLATION 120MM (ENDOMECHANICALS) ×3 IMPLANT
PACK LAPAROSCOPY BASIN (CUSTOM PROCEDURE TRAY) ×3 IMPLANT
PACK TRENDGUARD 450 HYBRID PRO (MISCELLANEOUS) ×1 IMPLANT
PACK TRENDGUARD 600 HYBRD PROC (MISCELLANEOUS) IMPLANT
PAD ARMBOARD 7.5X6 YLW CONV (MISCELLANEOUS) ×6 IMPLANT
PAD OB MATERNITY 4.3X12.25 (PERSONAL CARE ITEMS) ×3 IMPLANT
PAD PREP 24X48 CUFFED NSTRL (MISCELLANEOUS) ×3 IMPLANT
SLEEVE SCD COMPRESS KNEE MED (MISCELLANEOUS) ×3 IMPLANT
SUT VICRYL 0 UR6 27IN ABS (SUTURE) ×3 IMPLANT
SUT VICRYL 4-0 PS2 18IN ABS (SUTURE) ×3 IMPLANT
TOWEL OR 17X24 6PK STRL BLUE (TOWEL DISPOSABLE) ×6 IMPLANT
TRENDGUARD 450 HYBRID PRO PACK (MISCELLANEOUS) ×3
TRENDGUARD 600 HYBRID PROC PK (MISCELLANEOUS)
TROCAR XCEL DIL TIP R 11M (ENDOMECHANICALS) ×3 IMPLANT
TUBING INSUFFLATION (TUBING) ×3 IMPLANT
WARMER LAPAROSCOPE (MISCELLANEOUS) ×3 IMPLANT

## 2017-10-04 NOTE — Anesthesia Postprocedure Evaluation (Signed)
Anesthesia Post Note  Patient: Akshaya Mincey  Procedure(s) Performed: LAPAROSCOPIC TUBAL LIGATION WITH FILSHIE CLIPS (Bilateral Abdomen)     Patient location during evaluation: PACU Anesthesia Type: General Level of consciousness: awake and alert Pain management: pain level controlled Vital Signs Assessment: post-procedure vital signs reviewed and stable Respiratory status: spontaneous breathing, nonlabored ventilation, respiratory function stable and patient connected to nasal cannula oxygen Cardiovascular status: blood pressure returned to baseline and stable Postop Assessment: no apparent nausea or vomiting Anesthetic complications: no    Last Vitals:  Vitals:   10/04/17 1100 10/04/17 1130  BP: (!) 130/92 135/89  Pulse: 74 73  Resp: (!) 23 20  Temp:  36.7 C  SpO2: 100% 100%    Last Pain:  Vitals:   10/04/17 1130  TempSrc:   PainSc: 3                  Ryan P Ellender

## 2017-10-04 NOTE — Transfer of Care (Signed)
Immediate Anesthesia Transfer of Care Note  Patient: Kayren Campas  Procedure(s) Performed: LAPAROSCOPIC TUBAL LIGATION WITH FILSHIE CLIPS (Bilateral Abdomen)  Patient Location: PACU  Anesthesia Type:General  Level of Consciousness: awake, alert  and oriented  Airway & Oxygen Therapy: Patient Spontanous Breathing and Patient connected to face mask oxygen  Post-op Assessment: Report given to RN and Post -op Vital signs reviewed and stable  Post vital signs: Reviewed and stable  Last Vitals:  Vitals:   10/04/17 0808 10/04/17 0932  BP: 104/63   Pulse: 74 (P) 89  Resp:  (!) (P) 21  Temp: 36.6 C (!) (P) 36.4 C  SpO2: 100% (P) 100%    Last Pain:  Vitals:   10/04/17 0808  TempSrc: Oral  PainSc: 4          Complications: No apparent anesthesia complications

## 2017-10-04 NOTE — H&P (Signed)
Barbara Gilbert is an 32 y.o. female. [id number] Patient's last menstrual period was 10/01/2017 (approximate). Patient is scheduled for laparoscopic BTL. She agrees to the use of Filshie clips and her questions were answered  Pertinent Gynecological History: Menses: flow is moderate Bleeding: normal Contraception: none DES exposure: denies Blood transfusions: none Sexually transmitted diseases: no past history Last pap: normal Date: 06/2017   Menstrual History:  Patient's last menstrual period was 10/01/2017 (approximate).    Past Medical History:  Diagnosis Date  . Anxiety attack   . Panic disorder   . 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 abortions     X 2----2012 and 2014    Family History  Problem Relation Age of Onset  . Fibromyalgia Mother   . Mental illness Mother   . Gout Father   . Asthma Sister    OB History    Gravida Para Term Preterm AB Living   3 1 1   2 1    SAB TAB Ectopic Multiple Live Births     2     1       Social History:  reports that she has been smoking.  She started smoking about 10 years ago. She has been smoking about 0.30 packs per day. she has never used smokeless tobacco. She reports that she uses drugs. Drug: Marijuana. She reports that she does not drink alcohol.  Allergies: No Known Allergies  Medications Prior to Admission  Medication Sig Dispense Refill Last Dose  . ALPRAZolam (XANAX) 0.5 MG tablet TAKE 1 TABLET BY MOUTH TWICE A DAY AS NEEDED FOR ANXIETY  0 10/03/2017 at Unknown time  . propranolol (INDERAL) 10 MG tablet Take 10 mg by mouth daily.   Past Week at Unknown time  . ibuprofen (ADVIL,MOTRIN) 600 MG tablet Take 1 tablet (600 mg total) by mouth every 6 (six) hours as needed. (Patient not taking: Reported on 07/03/2017) 30 tablet 0 Not Taking    Review of Systems  Constitutional: Negative.   Respiratory: Negative.    Cardiovascular: Negative.   Gastrointestinal: Negative.   Genitourinary: Negative.     Blood pressure 104/63, pulse 74, temperature 97.8 F (36.6 C), temperature source Oral, height 5\' 3"  (1.6 m), weight 55.3 kg (122 lb), last menstrual period 10/01/2017, SpO2 100 %. Physical Exam  Vitals reviewed. Constitutional: She appears well-developed. No distress.  HENT:  Head: Normocephalic.  Neck: Normal range of motion.  Cardiovascular: Normal rate.  Respiratory: Effort normal. No respiratory distress.  GI: Soft. She exhibits no distension.  Psychiatric: She has a normal mood and affect. Her behavior is normal.    Results for orders placed or performed during the hospital encounter of 10/03/17 (from the past 24 hour(s))  Pregnancy, urine POC     Status: None   Collection Time: 10/03/17  1:38 PM  Result Value Ref Range   Preg Test, Ur NEGATIVE NEGATIVE   CBC    Component Value Date/Time   WBC 32.7 (H) 06/06/2017 1605   RBC 4.62 06/06/2017 1605   HGB 15.2 (H) 06/06/2017 1605   HCT 43.3 06/06/2017 1605   PLT 235 06/06/2017 1605   MCV 93.7 06/06/2017 1605   MCH 32.9 06/06/2017 1605   MCHC 35.1 06/06/2017 1605   RDW 12.5 06/06/2017 1605    Assessment/Plan:  32 y.o. [id number] with undesired fertility desires permanent sterilization. Risks and benefits of laparoscopic tubal sterilization  procedure was discussed with the patient including permanence of method, use of Filshie clips, pain and menstrual disorders, bleeding, infection, injury to surrounding organs, anesthesia and need for additional procedures. Risk failure of 0.5-1% with increased risk of ectopic gestation if pregnancy occurs was also discussed with patient. Patient verbalized understanding and all questions were answered.     Scheryl DarterJames Annabelle Rexroad 10/04/2017, 8:22 AM

## 2017-10-04 NOTE — Op Note (Signed)
Barbara Gilbert 10/04/2017  PREOPERATIVE DIAGNOSIS:  Undesired fertility  POSTOPERATIVE DIAGNOSIS:  Undesired fertility  PROCEDURE:  Laparoscopic Bilateral Tubal Sterilization using Filshie Clips   SURGEON: Adam PhenixArnold, Mone Commisso G, MD   ANESTHESIA:  General endotracheal  COMPLICATIONS:  None immediate.  ESTIMATED BLOOD LOSS:  Less than 20 ml.  FLUIDS: 1000 ml LR.  URINE OUTPUT:  Not recorded  INDICATIONS: 32 y.o. [id number]  with undesired fertility, desires permanent sterilization. Other reversible forms of contraception were discussed with patient; she declines all other modalities.  Risks of procedure discussed with patient including permanence of method, bleeding, infection, injury to surrounding organs and need for additional procedures including laparotomy, risk of regret.  Failure risk of 0.5-1% with increased risk of ectopic gestation if pregnancy occurs was also discussed with patient.      FINDINGS:  Normal uterus, tubes, and ovaries.  TECHNIQUE:  The patient was taken to the operating room where general anesthesia was obtained without difficulty.  She was then placed in the dorsal lithotomy position and prepared and draped in sterile fashion.  After an adequate timeout was performed, a bivalved speculum was then placed in the patient's vagina, and the anterior lip of cervix grasped with the single-tooth tenaculum.  The uterine manipulator was then advanced into the uterus.  The speculum was removed from the vagina.  Attention was then turned to the patient's abdomen where a 11-mm skin incision was made in the umbilical fold.  The 11-mm trocar and sleeve were then advanced without difficulty after insufflation with the Veress needle,  The abdomen was then insufflated with carbon dioxide gas and adequate pneumoperitoneum was obtained.  A survey of the patient's pelvis and abdomen revealed adhesions in the left adnexa.  The fallopian tubes were observed and found to be normal in appearance. The  Filshie clip applicator was placed through the operative port, and a Filshie clip was placed on the right fallopian tube ,about 2 cm from the cornual attachment, with care given to incorporate the underlying mesosalpinx.  A similar process was carried out on the contralateral side allowing for bilateral tubal sterilization.   Good hemostasis was noted overall. The instruments were then removed from the patient's abdomen and the fascial incision was repaired with 0 Vicryl, and the skin was closed with 4-0 Vicryl.  The uterine manipulator was removed from the vagina without complications. The patient tolerated the procedure well.  Sponge, lap, and needle counts were correct times two.  The patient was then taken to the recovery room awake, extubated and in stable condition.  Adam PhenixArnold, Fedrick Cefalu G, MD 10/04/2017 9:24 AM

## 2017-10-04 NOTE — Anesthesia Preprocedure Evaluation (Addendum)
Anesthesia Evaluation    Reviewed: Allergy & Precautions, NPO status , Patient's Chart, lab work & pertinent test results  Airway Mallampati: II  TM Distance: >3 FB Neck ROM: Full    Dental  (+) Missing,    Pulmonary Current Smoker,    Pulmonary exam normal breath sounds clear to auscultation       Cardiovascular negative cardio ROS Normal cardiovascular exam Rhythm:Regular Rate:Normal     Neuro/Psych PSYCHIATRIC DISORDERS Anxiety PTSD (post-traumatic stress disorder)negative neurological ROS     GI/Hepatic negative GI ROS, (+)     substance abuse  ,   Endo/Other  negative endocrine ROS  Renal/GU negative Renal ROS     Musculoskeletal negative musculoskeletal ROS (+)   Abdominal   Peds  Hematology negative hematology ROS (+)   Anesthesia Other Findings Undesired Fertility  Reproductive/Obstetrics hcg negative                            Anesthesia Physical Anesthesia Plan  ASA: II  Anesthesia Plan: General   Post-op Pain Management:    Induction: Intravenous  PONV Risk Score and Plan: 2 and Midazolam, Scopolamine patch - Pre-op, Dexamethasone, Ondansetron and Treatment may vary due to age or medical condition  Airway Management Planned: Oral ETT  Additional Equipment:   Intra-op Plan:   Post-operative Plan: Extubation in OR  Informed Consent: I have reviewed the patients History and Physical, chart, labs and discussed the procedure including the risks, benefits and alternatives for the proposed anesthesia with the patient or authorized representative who has indicated his/her understanding and acceptance.   Dental advisory given  Plan Discussed with: CRNA  Anesthesia Plan Comments:         Anesthesia Quick Evaluation

## 2017-10-04 NOTE — Anesthesia Procedure Notes (Addendum)
Procedure Name: Intubation Performed by: Verita Lamb, CRNA Pre-anesthesia Checklist: Patient identified, Emergency Drugs available, Suction available, Patient being monitored and Timeout performed Patient Re-evaluated:Patient Re-evaluated prior to induction Oxygen Delivery Method: Circle system utilized Preoxygenation: Pre-oxygenation with 100% oxygen Induction Type: IV induction Ventilation: Mask ventilation without difficulty Laryngoscope Size: Mac and 3 Grade View: Grade I Tube type: Oral Number of attempts: 1 Airway Equipment and Method: Stylet Placement Confirmation: ETT inserted through vocal cords under direct vision,  positive ETCO2,  CO2 detector and breath sounds checked- equal and bilateral Secured at: 21 cm Tube secured with: Tape Dental Injury: Teeth and Oropharynx as per pre-operative assessment

## 2017-10-04 NOTE — Discharge Instructions (Signed)
No Ibuprofen products until after 3:30pm today.  Post Anesthesia Home Care Instructions  Activity: Get plenty of rest for the remainder of the day. A responsible individual must stay with you for 24 hours following the procedure.  For the next 24 hours, DO NOT: -Drive a car -Advertising copywriter -Drink alcoholic beverages -Take any medication unless instructed by your physician -Make any legal decisions or sign important papers.  Meals: Start with liquid foods such as gelatin or soup. Progress to regular foods as tolerated. Avoid greasy, spicy, heavy foods. If nausea and/or vomiting occur, drink only clear liquids until the nausea and/or vomiting subsides. Call your physician if vomiting continues.  Special Instructions/Symptoms: Your throat may feel dry or sore from the anesthesia or the breathing tube placed in your throat during surgery. If this causes discomfort, gargle with warm salt water. The discomfort should disappear within 24 hours.  If you had a scopolamine patch placed behind your ear for the management of post- operative nausea and/or vomiting:  1. The medication in the patch is effective for 72 hours, after which it should be removed.  Wrap patch in a tissue and discard in the trash. Wash hands thoroughly with soap and water. 2. You may remove the patch earlier than 72 hours if you experience unpleasant side effects which may include dry mouth, dizziness or visual disturbances. 3. Avoid touching the patch. Wash your hands with soap and water after contact with the patch.   Laparoscopic Tubal Ligation, Care After Refer to this sheet in the next few weeks. These instructions provide you with information about caring for yourself after your procedure. Your health care provider may also give you more specific instructions. Your treatment has been planned according to current medical practices, but problems sometimes occur. Call your health care provider if you have any problems or  questions after your procedure. What can I expect after the procedure? After the procedure, it is common to have:  A sore throat.  Discomfort in your shoulder.  Mild discomfort or cramping in your abdomen.  Gas pains.  Pain or soreness in the area where the surgical cut (incision) was made.  A bloated feeling.  Tiredness.  Nausea.  Vomiting.  Follow these instructions at home: Medicines  Take over-the-counter and prescription medicines only as told by your health care provider.  Do not take aspirin because it can cause bleeding.  Do not drive or operate heavy machinery while taking prescription pain medicine. Activity  Rest for the rest of the day.  Return to your normal activities as told by your health care provider. Ask your health care provider what activities are safe for you. Incision care   Follow instructions from your health care provider about how to take care of your incision. Make sure you: ? Wash your hands with soap and water before you change your bandage (dressing). If soap and water are not available, use hand sanitizer. ? Change your dressing as told by your health care provider. ? Leave stitches (sutures) in place. They may need to stay in place for 2 weeks or longer.  Check your incision area every day for signs of infection. Check for: ? More redness, swelling, or pain. ? More fluid or blood. ? Warmth. ? Pus or a bad smell. Other Instructions  Do not take baths, swim, or use a hot tub until your health care provider approves. You may take showers.  Keep all follow-up visits as told by your health care provider. This is  important.  Have someone help you with your daily household tasks for the first few days. Contact a health care provider if:  You have more redness, swelling, or pain around your incision.  Your incision feels warm to the touch.  You have pus or a bad smell coming from your incision.  The edges of your incision break  open after the sutures have been removed.  Your pain does not improve after 2-3 days.  You have a rash.  You repeatedly become dizzy or light-headed.  Your pain medicine is not helping.  You are constipated. Get help right away if:  You have a fever.  You faint.  You have increasing pain in your abdomen.  You have severe pain in one or both of your shoulders.  You have fluid or blood coming from your sutures or from your vagina.  You have shortness of breath or difficulty breathing.  You have chest pain or leg pain.  You have ongoing nausea, vomiting, or diarrhea. This information is not intended to replace advice given to you by your health care provider. Make sure you discuss any questions you have with your health care provider. Document Released: 02/11/2005 Document Revised: 12/28/2015 Document Reviewed: 07/05/2015 Elsevier Interactive Patient Education  [employer]2018 [employer] Inc[employer]

## 2017-10-05 ENCOUNTER — Encounter (HOSPITAL_BASED_OUTPATIENT_CLINIC_OR_DEPARTMENT_OTHER): Payer: Self-pay | Admitting: Obstetrics & Gynecology

## 2017-10-06 ENCOUNTER — Telehealth: Payer: Self-pay

## 2017-10-06 NOTE — Telephone Encounter (Signed)
Patient wants refill on Oxy pain meds. I told patient that a message has been sent to the Doctor and I am awaiting a response. She got the pain meds on 10/04/17 and she is already out. Told her to try Extra strength Tylenol, until Dr. Debroah LoopArnold respond with a decision.

## 2017-10-17 ENCOUNTER — Encounter: Payer: Medicaid Other | Admitting: Obstetrics & Gynecology

## 2017-10-25 ENCOUNTER — Ambulatory Visit (INDEPENDENT_AMBULATORY_CARE_PROVIDER_SITE_OTHER): Payer: Medicaid Other | Admitting: Obstetrics & Gynecology

## 2017-10-25 ENCOUNTER — Encounter: Payer: Self-pay | Admitting: Obstetrics & Gynecology

## 2017-10-25 VITALS — BP 115/77 | HR 71 | Wt 116.0 lb

## 2017-10-25 DIAGNOSIS — Z9889 Other specified postprocedural states: Secondary | ICD-10-CM

## 2017-10-25 NOTE — Progress Notes (Signed)
Subjective:     Barbara Gilbert is a 32 y.o. female who presents to the clinic 3 weeks status post laparoscopy and BTL with filshie clips for requested sterilization. Eating a regular diet without difficulty. Bowel movements are normal. The patient is not having any pain.  The following portions of the patient's history were reviewed and updated as appropriate: allergies, current medications, past family history, past medical history, past social history, past surgical history and problem list.  Review of Systems Pertinent items are noted in HPI.    Objective:    LMP 10/01/2017 (Approximate) Comment: current period General:  alert, cooperative and no distress  Abdomen: soft, bowel sounds active, non-tender  Incision:   healing well, no drainage, no erythema, no hernia, no seroma, no swelling, no dehiscence, incision well approximated     Assessment:    Doing well postoperatively. Operative findings again reviewed. Pathology report discussed.    Plan:    1. Continue any current medications. 2. Wound care discussed. 3. Activity restrictions: none 4. Anticipated return to work: now. 5. Follow up: as needed   Barbara Gilbert, Barbara Battie G, MD 10/25/2017

## 2017-10-25 NOTE — Patient Instructions (Signed)
Laparoscopic Tubal Ligation, Care After °Refer to this sheet in the next few weeks. These instructions provide you with information about caring for yourself after your procedure. Your health care provider may also give you more specific instructions. Your treatment has been planned according to current medical practices, but problems sometimes occur. Call your health care provider if you have any problems or questions after your procedure. °What can I expect after the procedure? °After the procedure, it is common to have: °· A sore throat. °· Discomfort in your shoulder. °· Mild discomfort or cramping in your abdomen. °· Gas pains. °· Pain or soreness in the area where the surgical cut (incision) was made. °· A bloated feeling. °· Tiredness. °· Nausea. °· Vomiting. ° °Follow these instructions at home: °Medicines °· Take over-the-counter and prescription medicines only as told by your health care provider. °· Do not take aspirin because it can cause bleeding. °· Do not drive or operate heavy machinery while taking prescription pain medicine. °Activity °· Rest for the rest of the day. °· Return to your normal activities as told by your health care provider. Ask your health care provider what activities are safe for you. °Incision care ° °· Follow instructions from your health care provider about how to take care of your incision. Make sure you: °? Wash your hands with soap and water before you change your bandage (dressing). If soap and water are not available, use hand sanitizer. °? Change your dressing as told by your health care provider. °? Leave stitches (sutures) in place. They may need to stay in place for 2 weeks or longer. °· Check your incision area every day for signs of infection. Check for: °? More redness, swelling, or pain. °? More fluid or blood. °? Warmth. °? Pus or a bad smell. °Other Instructions °· Do not take baths, swim, or use a hot tub until your health care provider approves. You may take  showers. °· Keep all follow-up visits as told by your health care provider. This is important. °· Have someone help you with your daily household tasks for the first few days. °Contact a health care provider if: °· You have more redness, swelling, or pain around your incision. °· Your incision feels warm to the touch. °· You have pus or a bad smell coming from your incision. °· The edges of your incision break open after the sutures have been removed. °· Your pain does not improve after 2-3 days. °· You have a rash. °· You repeatedly become dizzy or light-headed. °· Your pain medicine is not helping. °· You are constipated. °Get help right away if: °· You have a fever. °· You faint. °· You have increasing pain in your abdomen. °· You have severe pain in one or both of your shoulders. °· You have fluid or blood coming from your sutures or from your vagina. °· You have shortness of breath or difficulty breathing. °· You have chest pain or leg pain. °· You have ongoing nausea, vomiting, or diarrhea. °This information is not intended to replace advice given to you by your health care provider. Make sure you discuss any questions you have with your health care provider. °Document Released: 02/11/2005 Document Revised: 12/28/2015 Document Reviewed: 07/05/2015 °Elsevier Interactive Patient Education © 2018 [employer] ° °

## 2017-10-25 NOTE — Progress Notes (Signed)
Presents for Post-Op FU. Reports no problems today.

## 2017-11-30 ENCOUNTER — Emergency Department (HOSPITAL_COMMUNITY): Payer: Medicaid Other

## 2017-11-30 ENCOUNTER — Encounter (HOSPITAL_COMMUNITY): Payer: Self-pay

## 2017-11-30 ENCOUNTER — Other Ambulatory Visit: Payer: Self-pay

## 2017-11-30 ENCOUNTER — Emergency Department (HOSPITAL_COMMUNITY)
Admission: EM | Admit: 2017-11-30 | Discharge: 2017-11-30 | Disposition: A | Discharge status: Home / Self Care | Payer: Medicaid Other | Attending: Emergency Medicine | Admitting: Emergency Medicine

## 2017-11-30 DIAGNOSIS — Z79899 Other long term (current) drug therapy: Secondary | ICD-10-CM | POA: Insufficient documentation

## 2017-11-30 DIAGNOSIS — J020 Streptococcal pharyngitis: Secondary | ICD-10-CM

## 2017-11-30 DIAGNOSIS — F17200 Nicotine dependence, unspecified, uncomplicated: Secondary | ICD-10-CM | POA: Diagnosis not present

## 2017-11-30 DIAGNOSIS — R070 Pain in throat: Secondary | ICD-10-CM | POA: Diagnosis present

## 2017-11-30 LAB — CBC WITH DIFFERENTIAL/PLATELET
BASOS PCT: 0 %
Basophils Absolute: 0 10*3/uL (ref 0.0–0.1)
EOS PCT: 0 %
Eosinophils Absolute: 0 10*3/uL (ref 0.0–0.7)
HEMATOCRIT: 42.4 % (ref 36.0–46.0)
Hemoglobin: 14.2 g/dL (ref 12.0–15.0)
LYMPHS PCT: 8 %
Lymphs Abs: 2.1 10*3/uL (ref 0.7–4.0)
MCH: 31.3 pg (ref 26.0–34.0)
MCHC: 33.5 g/dL (ref 30.0–36.0)
MCV: 93.6 fL (ref 78.0–100.0)
MONO ABS: 2.7 10*3/uL — AB (ref 0.1–1.0)
Monocytes Relative: 10 %
NEUTROS PCT: 82 %
Neutro Abs: 21.8 10*3/uL — ABNORMAL HIGH (ref 1.7–7.7)
PLATELETS: 303 10*3/uL (ref 150–400)
RBC: 4.53 MIL/uL (ref 3.87–5.11)
RDW: 13 % (ref 11.5–15.5)
WBC: 26.6 10*3/uL — AB (ref 4.0–10.5)

## 2017-11-30 LAB — I-STAT BETA HCG BLOOD, ED (MC, WL, AP ONLY): I-stat hCG, quantitative: <5 m[IU]/mL (ref <5)

## 2017-11-30 LAB — GROUP A STREP BY PCR: Group A Strep by PCR: DETECTED — AB

## 2017-11-30 MED ORDER — HYDROCODONE-ACETAMINOPHEN 7.5-325 MG/15ML PO SOLN
10.0000 mL | Freq: Once | ORAL | Status: AC
  Administered 2017-11-30: 10 mL via ORAL
  Filled 2017-11-30: qty 15

## 2017-11-30 MED ORDER — DEXAMETHASONE SODIUM PHOSPHATE 10 MG/ML IJ SOLN
10.0000 mg | Freq: Once | INTRAMUSCULAR | Status: AC
  Administered 2017-11-30: 10 mg via INTRAVENOUS
  Filled 2017-11-30: qty 1

## 2017-11-30 MED ORDER — IOPAMIDOL (ISOVUE-300) INJECTION 61%
INTRAVENOUS | Status: AC
  Filled 2017-11-30: qty 100

## 2017-11-30 MED ORDER — SODIUM CHLORIDE 0.9 % IV BOLUS
1000.0000 mL | Freq: Once | INTRAVENOUS | Status: AC
  Administered 2017-11-30: 1000 mL via INTRAVENOUS

## 2017-11-30 MED ORDER — HYDROCODONE-ACETAMINOPHEN 7.5-325 MG/15ML PO SOLN: 15.0000 mL | Freq: Four times a day (QID) | ORAL | 0 refills | Status: DC | PRN

## 2017-11-30 MED ORDER — IOPAMIDOL (ISOVUE-300) INJECTION 61%
100.0000 mL | Freq: Once | INTRAVENOUS | Status: AC | PRN
  Administered 2017-11-30: 100 mL via INTRAVENOUS

## 2017-11-30 MED ORDER — CLINDAMYCIN PHOSPHATE 600 MG/50ML IV SOLN
600.0000 mg | Freq: Once | INTRAVENOUS | Status: AC
  Administered 2017-11-30: 600 mg via INTRAVENOUS
  Filled 2017-11-30: qty 50

## 2017-11-30 MED ORDER — CLINDAMYCIN HCL 300 MG PO CAPS: 300.0000 mg | ORAL_CAPSULE | Freq: Three times a day (TID) | ORAL | 0 refills | Status: DC

## 2017-11-30 MED ORDER — KETOROLAC TROMETHAMINE 30 MG/ML IJ SOLN
30.0000 mg | Freq: Once | INTRAMUSCULAR | Status: AC
  Administered 2017-11-30: 30 mg via INTRAVENOUS
  Filled 2017-11-30: qty 1

## 2017-11-30 NOTE — ED Triage Notes (Signed)
Pt has written note that states it is painful to talk and swallow. Note also states she is having right sided ear pain.
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
## 2017-11-30 NOTE — ED Notes (Signed)
Pt departed in NAD, refused use of wheelchair.  

## 2017-11-30 NOTE — Discharge Instructions (Addendum)
Take antibiotics as prescribed until all gone.  Take ibuprofen or Tylenol for pain.  Take Lortab elixir for severe pain.  Drink plenty of fluids.  He can also try Chloraseptic throat spray for pain relief.  Please follow-up with family doctor in 2 to 3 days for recheck.  Return if worsening.

## 2017-12-01 ENCOUNTER — Telehealth: Payer: Self-pay

## 2017-12-01 NOTE — Telephone Encounter (Signed)
Post ED Visit - Positive Culture Follow-up  Culture report reviewed by antimicrobial stewardship pharmacist:  []  Enzo BiNathan Batchelder, Pharm.D. []  Celedonio MiyamotoJeremy Frens, Pharm.D., BCPS AQ-ID []  Garvin FilaMike Maccia, Pharm.D., BCPS [x]  Georgina PillionElizabeth Martin, Pharm.D., BCPS []  Pauls ValleyMinh Pham, VermontPharm.D., BCPS, AAHIVP []  Estella HuskMichelle Turner, Pharm.D., BCPS, AAHIVP []  Lysle Pearlachel Rumbarger, PharmD, BCPS []  Blake DivineShannon Parkey, PharmD []  Pollyann SamplesAndy Johnston, PharmD, BCPS  Positive Strep culture Treated with Clindamycin, organism sensitive to the same and no further patient follow-up is required at this time.  Jerry CarasCullom, Yatzil Clippinger Burnett 12/01/2017, 9:28 AM

## 2018-05-17 ENCOUNTER — Ambulatory Visit: Payer: Medicaid Other | Admitting: Obstetrics

## 2018-05-17 ENCOUNTER — Encounter: Payer: Self-pay | Admitting: Obstetrics

## 2018-05-17 ENCOUNTER — Other Ambulatory Visit (HOSPITAL_COMMUNITY)
Admission: RE | Admit: 2018-05-17 | Discharge: 2018-05-17 | Disposition: A | Discharge status: Home / Self Care | Payer: Medicaid Other | Source: Ambulatory Visit | Attending: Obstetrics | Admitting: Obstetrics

## 2018-05-17 VITALS — BP 114/71 | HR 88 | Ht 63.0 in | Wt 123.2 lb

## 2018-05-17 DIAGNOSIS — A749 Chlamydial infection, unspecified: Secondary | ICD-10-CM | POA: Diagnosis not present

## 2018-05-17 DIAGNOSIS — Z113 Encounter for screening for infections with a predominantly sexual mode of transmission: Secondary | ICD-10-CM | POA: Insufficient documentation

## 2018-05-17 DIAGNOSIS — B9689 Other specified bacterial agents as the cause of diseases classified elsewhere: Secondary | ICD-10-CM | POA: Diagnosis not present

## 2018-05-17 DIAGNOSIS — N760 Acute vaginitis: Secondary | ICD-10-CM | POA: Insufficient documentation

## 2018-05-17 DIAGNOSIS — Z202 Contact with and (suspected) exposure to infections with a predominantly sexual mode of transmission: Secondary | ICD-10-CM | POA: Diagnosis not present

## 2018-05-17 DIAGNOSIS — Z7689 Persons encountering health services in other specified circumstances: Secondary | ICD-10-CM

## 2018-05-17 MED ORDER — CEFTRIAXONE SODIUM 250 MG IJ SOLR
250.0000 mg | Freq: Once | INTRAMUSCULAR | Status: AC
  Administered 2018-05-17: 250 mg via INTRAMUSCULAR
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
## 2018-05-18 LAB — HIV ANTIBODY (ROUTINE TESTING W REFLEX): HIV Screen 4th Generation wRfx: NONREACTIVE

## 2018-05-18 LAB — HEPATITIS B SURFACE ANTIGEN: Hepatitis B Surface Ag: NEGATIVE

## 2018-05-18 LAB — CERVICOVAGINAL ANCILLARY ONLY
Bacterial vaginitis: POSITIVE — AB
CANDIDA VAGINITIS: NEGATIVE
CHLAMYDIA, DNA PROBE: POSITIVE — AB
NEISSERIA GONORRHEA: NEGATIVE
Trichomonas: NEGATIVE

## 2018-05-18 LAB — RPR: RPR Ser Ql: NONREACTIVE

## 2018-05-18 LAB — HEPATITIS C ANTIBODY: Hep C Virus Ab: <0.1 s/co ratio (ref 0.0–0.9)

## 2018-05-19 ENCOUNTER — Other Ambulatory Visit: Payer: Self-pay | Admitting: Obstetrics

## 2018-05-19 DIAGNOSIS — N760 Acute vaginitis: Secondary | ICD-10-CM

## 2018-05-19 DIAGNOSIS — B9689 Other specified bacterial agents as the cause of diseases classified elsewhere: Secondary | ICD-10-CM

## 2018-05-19 DIAGNOSIS — A749 Chlamydial infection, unspecified: Secondary | ICD-10-CM

## 2018-05-19 MED ORDER — TINIDAZOLE 500 MG PO TABS: 1000.0000 mg | ORAL_TABLET | Freq: Every day | ORAL | 2 refills | Status: DC

## 2018-05-19 MED ORDER — AZITHROMYCIN 500 MG PO TABS: 1000.0000 mg | ORAL_TABLET | Freq: Once | ORAL | 0 refills | Status: AC

## 2018-06-11 ENCOUNTER — Ambulatory Visit: Payer: Medicaid Other | Admitting: Obstetrics

## 2018-06-26 ENCOUNTER — Ambulatory Visit: Payer: Medicaid Other | Admitting: Obstetrics

## 2018-07-09 ENCOUNTER — Ambulatory Visit: Payer: Medicaid Other | Admitting: Obstetrics

## 2018-07-09 ENCOUNTER — Other Ambulatory Visit (HOSPITAL_COMMUNITY)
Admission: RE | Admit: 2018-07-09 | Discharge: 2018-07-09 | Disposition: A | Discharge status: Home / Self Care | Payer: Medicaid Other | Source: Ambulatory Visit | Attending: Obstetrics | Admitting: Obstetrics

## 2018-07-09 ENCOUNTER — Encounter: Payer: Self-pay | Admitting: Obstetrics

## 2018-07-09 VITALS — BP 111/68 | HR 63 | Ht 63.0 in | Wt 124.0 lb

## 2018-07-09 DIAGNOSIS — Z0000 Encounter for general adult medical examination without abnormal findings: Secondary | ICD-10-CM

## 2018-07-09 DIAGNOSIS — N898 Other specified noninflammatory disorders of vagina: Secondary | ICD-10-CM | POA: Insufficient documentation

## 2018-07-09 DIAGNOSIS — Z01419 Encounter for gynecological examination (general) (routine) without abnormal findings: Secondary | ICD-10-CM | POA: Diagnosis not present

## 2018-07-09 DIAGNOSIS — Z309 Encounter for contraceptive management, unspecified: Secondary | ICD-10-CM

## 2018-07-09 DIAGNOSIS — Z113 Encounter for screening for infections with a predominantly sexual mode of transmission: Secondary | ICD-10-CM
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
## 2018-07-09 NOTE — Progress Notes (Signed)
Pt presents for pap and TOC chlamydia. Pt desires to have all repeat std screen. Last Pap was 07/03/18

## 2018-07-10 ENCOUNTER — Ambulatory Visit: Payer: Medicaid Other | Admitting: Obstetrics

## 2018-07-10 LAB — CERVICOVAGINAL ANCILLARY ONLY
BACTERIAL VAGINITIS: NEGATIVE
CHLAMYDIA, DNA PROBE: NEGATIVE
Candida vaginitis: NEGATIVE
Neisseria Gonorrhea: NEGATIVE
Trichomonas: NEGATIVE

## 2018-07-10 LAB — RPR: RPR Ser Ql: NONREACTIVE

## 2018-07-10 LAB — HEPATITIS C ANTIBODY

## 2018-07-10 LAB — HIV ANTIBODY (ROUTINE TESTING W REFLEX): HIV Screen 4th Generation wRfx: NONREACTIVE

## 2018-07-10 LAB — HEPATITIS B SURFACE ANTIGEN: Hepatitis B Surface Ag: NEGATIVE

## 2018-07-11 LAB — CYTOLOGY - PAP
Diagnosis: NEGATIVE
HPV (WINDOPATH): NOT DETECTED

## 2018-07-12 ENCOUNTER — Telehealth: Payer: Self-pay

## 2018-07-12 ENCOUNTER — Other Ambulatory Visit: Payer: Self-pay | Admitting: Obstetrics

## 2018-07-12 NOTE — Telephone Encounter (Signed)
Called to advise of results, left vm. No rx sent by provider, pt was negative for BV.

## 2019-01-15 ENCOUNTER — Ambulatory Visit: Payer: Medicaid Other

## 2019-01-23 ENCOUNTER — Other Ambulatory Visit (HOSPITAL_COMMUNITY)
Admission: RE | Admit: 2019-01-23 | Discharge: 2019-01-23 | Disposition: A | Discharge status: Home / Self Care | Payer: Medicaid Other | Source: Ambulatory Visit | Attending: Obstetrics | Admitting: Obstetrics

## 2019-01-23 ENCOUNTER — Other Ambulatory Visit: Payer: Self-pay

## 2019-01-23 ENCOUNTER — Ambulatory Visit (INDEPENDENT_AMBULATORY_CARE_PROVIDER_SITE_OTHER): Payer: Medicaid Other

## 2019-01-23 DIAGNOSIS — Z113 Encounter for screening for infections with a predominantly sexual mode of transmission: Secondary | ICD-10-CM

## 2019-01-23 DIAGNOSIS — N898 Other specified noninflammatory disorders of vagina: Secondary | ICD-10-CM | POA: Diagnosis present

## 2019-01-23 NOTE — Progress Notes (Signed)
SUBJECTIVE:  33 y.o. female complains of white vaginal discharge.  Denies abnormal vaginal bleeding or significant pelvic pain or fever. No UTI symptoms. Denies history of known exposure to STD.  OBJECTIVE:  She appears well, afebrile. Urine dipstick: not done.  ASSESSMENT:  Vaginal Discharge  Vaginal Odor   PLAN:  GC, chlamydia, trichomonas, BVAG, CVAG probe sent to lab. Treatment: To be determined once lab results are received ROV prn if symptoms persist or worsen.

## 2019-01-24 LAB — HEPATITIS B SURFACE ANTIGEN: Hepatitis B Surface Ag: NEGATIVE

## 2019-01-24 LAB — RPR: RPR Ser Ql: NONREACTIVE

## 2019-01-24 LAB — HEPATITIS C ANTIBODY: Hep C Virus Ab: <0.1 s/co ratio (ref 0.0–0.9)

## 2019-01-24 LAB — HIV ANTIBODY (ROUTINE TESTING W REFLEX): HIV Screen 4th Generation wRfx: NONREACTIVE

## 2019-01-25 NOTE — Progress Notes (Signed)
Patient ID: Barbara Gilbert, female   DOB: [birth year], 33 y.o.   MRN: [id number] I have reviewed the chart and agree with nursing staff's documentation of this patient's encounter.  Emeterio Reeve, MD 01/25/2019 3:04 PM

## 2019-01-29 LAB — CERVICOVAGINAL ANCILLARY ONLY
Bacterial vaginitis: POSITIVE — AB
Candida vaginitis: NEGATIVE
Chlamydia: NEGATIVE
Neisseria Gonorrhea: NEGATIVE
Trichomonas: NEGATIVE

## 2019-01-31 ENCOUNTER — Other Ambulatory Visit: Payer: Self-pay | Admitting: Obstetrics & Gynecology

## 2019-01-31 MED ORDER — METRONIDAZOLE 500 MG PO TABS: 500.0000 mg | ORAL_TABLET | Freq: Two times a day (BID) | ORAL | 0 refills | Status: DC

## 2019-01-31 NOTE — Progress Notes (Signed)
Flagyl prescribed for BV 

## 2019-03-11 ENCOUNTER — Emergency Department (HOSPITAL_COMMUNITY): Payer: Medicaid Other

## 2019-03-11 ENCOUNTER — Encounter

## 2019-03-11 ENCOUNTER — Encounter (HOSPITAL_COMMUNITY): Payer: Self-pay | Admitting: Emergency Medicine

## 2019-03-11 ENCOUNTER — Other Ambulatory Visit: Payer: Self-pay

## 2019-03-11 ENCOUNTER — Inpatient Hospital Stay (HOSPITAL_COMMUNITY): Payer: Medicaid Other

## 2019-03-11 ENCOUNTER — Inpatient Hospital Stay (HOSPITAL_COMMUNITY)
Admission: EM | Admit: 2019-03-11 | Discharge: 2019-03-12 | DRG: 089 | Disposition: A | Discharge status: Home / Self Care | Payer: Medicaid Other | Attending: General Surgery | Admitting: General Surgery

## 2019-03-11 DIAGNOSIS — R569 Unspecified convulsions: Secondary | ICD-10-CM | POA: Diagnosis not present

## 2019-03-11 DIAGNOSIS — Z20828 Contact with and (suspected) exposure to other viral communicable diseases: Secondary | ICD-10-CM | POA: Diagnosis present

## 2019-03-11 DIAGNOSIS — R451 Restlessness and agitation: Secondary | ICD-10-CM | POA: Diagnosis not present

## 2019-03-11 DIAGNOSIS — F17210 Nicotine dependence, cigarettes, uncomplicated: Secondary | ICD-10-CM | POA: Diagnosis present

## 2019-03-11 DIAGNOSIS — E041 Nontoxic single thyroid nodule: Secondary | ICD-10-CM

## 2019-03-11 DIAGNOSIS — S060X9A Concussion with loss of consciousness of unspecified duration, initial encounter: Secondary | ICD-10-CM | POA: Diagnosis present

## 2019-03-11 DIAGNOSIS — E876 Hypokalemia: Secondary | ICD-10-CM

## 2019-03-11 DIAGNOSIS — W130XXA Fall from, out of or through balcony, initial encounter: Secondary | ICD-10-CM | POA: Diagnosis present

## 2019-03-11 DIAGNOSIS — W19XXXA Unspecified fall, initial encounter: Secondary | ICD-10-CM

## 2019-03-11 DIAGNOSIS — R561 Post traumatic seizures: Secondary | ICD-10-CM | POA: Diagnosis present

## 2019-03-11 DIAGNOSIS — S0101XA Laceration without foreign body of scalp, initial encounter: Secondary | ICD-10-CM | POA: Diagnosis present

## 2019-03-11 HISTORY — DX: Unspecified convulsions: R56.9

## 2019-03-11 LAB — CBC
HCT: 46 % (ref 36.0–46.0)
HCT: 34.2 % — ABNORMAL LOW (ref 36.0–46.0)
Hemoglobin: 14.5 g/dL (ref 12.0–15.0)
Hemoglobin: 11.4 g/dL — ABNORMAL LOW (ref 12.0–15.0)
MCH: 31.3 pg (ref 26.0–34.0)
MCH: 31.1 pg (ref 26.0–34.0)
MCHC: 31.5 g/dL (ref 30.0–36.0)
MCHC: 33.3 g/dL (ref 30.0–36.0)
MCV: 99.4 fL (ref 80.0–100.0)
MCV: 93.4 fL (ref 80.0–100.0)
Platelets: 342 10*3/uL (ref 150–400)
Platelets: 231 10*3/uL (ref 150–400)
RBC: 4.63 MIL/uL (ref 3.87–5.11)
RBC: 3.66 MIL/uL — ABNORMAL LOW (ref 3.87–5.11)
RDW: 12.4 % (ref 11.5–15.5)
RDW: 12.6 % (ref 11.5–15.5)
WBC: 21.4 10*3/uL — ABNORMAL HIGH (ref 4.0–10.5)
WBC: 17.6 10*3/uL — ABNORMAL HIGH (ref 4.0–10.5)
nRBC: 0 % (ref 0.0–0.2)
nRBC: 0 % (ref 0.0–0.2)

## 2019-03-11 LAB — I-STAT CHEM 8, ED
BUN: 8 mg/dL (ref 6–20)
Calcium, Ion: 1.17 mmol/L (ref 1.15–1.40)
Chloride: 105 mmol/L (ref 98–111)
Creatinine, Ser: 1.3 mg/dL — ABNORMAL HIGH (ref 0.44–1.00)
Glucose, Bld: 203 mg/dL — ABNORMAL HIGH (ref 70–99)
HCT: 48 % — ABNORMAL HIGH (ref 36.0–46.0)
Hemoglobin: 16.3 g/dL — ABNORMAL HIGH (ref 12.0–15.0)
Potassium: 2.9 mmol/L — ABNORMAL LOW (ref 3.5–5.1)
Sodium: 138 mmol/L (ref 135–145)
TCO2: 9 mmol/L — ABNORMAL LOW (ref 22–32)

## 2019-03-11 LAB — COMPREHENSIVE METABOLIC PANEL
ALT: 15 U/L (ref 0–44)
AST: 34 U/L (ref 15–41)
Albumin: 4.3 g/dL (ref 3.5–5.0)
Alkaline Phosphatase: 66 U/L (ref 38–126)
Anion gap: 21 — ABNORMAL HIGH (ref 5–15)
BUN: 9 mg/dL (ref 6–20)
CO2: 9 mmol/L — ABNORMAL LOW (ref 22–32)
Calcium: 9.2 mg/dL (ref 8.9–10.3)
Chloride: 105 mmol/L (ref 98–111)
Creatinine, Ser: 1.57 mg/dL — ABNORMAL HIGH (ref 0.44–1.00)
GFR calc Af Amer: 51 mL/min — ABNORMAL LOW (ref >60)
GFR calc non Af Amer: 44 mL/min — ABNORMAL LOW (ref >60)
Glucose, Bld: 216 mg/dL — ABNORMAL HIGH (ref 70–99)
Potassium: 2.9 mmol/L — ABNORMAL LOW (ref 3.5–5.1)
Sodium: 135 mmol/L (ref 135–145)
Total Bilirubin: 0.7 mg/dL (ref 0.3–1.2)
Total Protein: 7.1 g/dL (ref 6.5–8.1)

## 2019-03-11 LAB — URINALYSIS, ROUTINE W REFLEX MICROSCOPIC
Bilirubin Urine: NEGATIVE
Glucose, UA: NEGATIVE mg/dL
Ketones, ur: 20 mg/dL — AB
Leukocytes,Ua: NEGATIVE
Nitrite: NEGATIVE
Protein, ur: NEGATIVE mg/dL
Specific Gravity, Urine: >1.046 — ABNORMAL HIGH (ref 1.005–1.030)
pH: 5 (ref 5.0–8.0)

## 2019-03-11 LAB — RAPID URINE DRUG SCREEN, HOSP PERFORMED
Amphetamines: NOT DETECTED
Barbiturates: NOT DETECTED
Benzodiazepines: POSITIVE — AB
Cocaine: NOT DETECTED
Opiates: POSITIVE — AB
Tetrahydrocannabinol: POSITIVE — AB

## 2019-03-11 LAB — SARS CORONAVIRUS 2 BY RT PCR (HOSPITAL ORDER, PERFORMED IN ~~LOC~~ HOSPITAL LAB): SARS Coronavirus 2: NEGATIVE

## 2019-03-11 LAB — SAMPLE TO BLOOD BANK

## 2019-03-11 LAB — BASIC METABOLIC PANEL
Anion gap: 10 (ref 5–15)
BUN: <5 mg/dL — ABNORMAL LOW (ref 6–20)
CO2: 16 mmol/L — ABNORMAL LOW (ref 22–32)
Calcium: 8.2 mg/dL — ABNORMAL LOW (ref 8.9–10.3)
Chloride: 112 mmol/L — ABNORMAL HIGH (ref 98–111)
Creatinine, Ser: 0.86 mg/dL (ref 0.44–1.00)
GFR calc Af Amer: >60 mL/min (ref >60)
GFR calc non Af Amer: >60 mL/min (ref >60)
Glucose, Bld: 65 mg/dL — ABNORMAL LOW (ref 70–99)
Potassium: 3.6 mmol/L (ref 3.5–5.1)
Sodium: 138 mmol/L (ref 135–145)

## 2019-03-11 LAB — PROTIME-INR
INR: 1.2 (ref 0.8–1.2)
Prothrombin Time: 15.4 seconds — ABNORMAL HIGH (ref 11.4–15.2)

## 2019-03-11 LAB — LACTIC ACID, PLASMA: Lactic Acid, Venous: >11 mmol/L (ref 0.5–1.9)

## 2019-03-11 LAB — I-STAT BETA HCG BLOOD, ED (MC, WL, AP ONLY): I-stat hCG, quantitative: <5 m[IU]/mL (ref <5)

## 2019-03-11 LAB — ETHANOL: Alcohol, Ethyl (B): <10 mg/dL (ref <10)

## 2019-03-11 LAB — CDS SEROLOGY

## 2019-03-11 MED ORDER — IOHEXOL 300 MG/ML  SOLN
100.0000 mL | Freq: Once | INTRAMUSCULAR | Status: AC | PRN
  Administered 2019-03-11: 100 mL via INTRAVENOUS

## 2019-03-11 MED ORDER — HALOPERIDOL LACTATE 5 MG/ML IJ SOLN
2.0000 mg | Freq: Four times a day (QID) | INTRAMUSCULAR | Status: DC | PRN
  Filled 2019-03-11: qty 1

## 2019-03-11 MED ORDER — ONDANSETRON 4 MG PO TBDP: 4.0000 mg | ORAL_TABLET | Freq: Four times a day (QID) | ORAL | Status: DC | PRN

## 2019-03-11 MED ORDER — LEVETIRACETAM IN NACL 1000 MG/100ML IV SOLN
1000.0000 mg | Freq: Two times a day (BID) | INTRAVENOUS | Status: DC
  Administered 2019-03-11: 1000 mg via INTRAVENOUS
  Filled 2019-03-11: qty 100

## 2019-03-11 MED ORDER — ACETAMINOPHEN 325 MG PO TABS: 650.0000 mg | ORAL_TABLET | ORAL | Status: DC | PRN

## 2019-03-11 MED ORDER — POTASSIUM CHLORIDE IN NACL 20-0.9 MEQ/L-% IV SOLN
INTRAVENOUS | Status: DC
  Administered 2019-03-11 – 2019-03-12 (×2): via INTRAVENOUS
  Filled 2019-03-11 (×2): qty 1000

## 2019-03-11 MED ORDER — VALPROATE SODIUM 500 MG/5ML IV SOLN
500.0000 mg | Freq: Two times a day (BID) | INTRAVENOUS | Status: DC
  Administered 2019-03-11: 500 mg via INTRAVENOUS
  Filled 2019-03-11 (×3): qty 5

## 2019-03-11 MED ORDER — LORAZEPAM 2 MG/ML IJ SOLN: 0.5000 mg | INTRAMUSCULAR | Status: DC | PRN

## 2019-03-11 MED ORDER — PANTOPRAZOLE SODIUM 40 MG IV SOLR
40.0000 mg | Freq: Every day | INTRAVENOUS | Status: DC
  Administered 2019-03-11: 14:00:00 40 mg via INTRAVENOUS
  Filled 2019-03-11: qty 40

## 2019-03-11 MED ORDER — KETOROLAC TROMETHAMINE 15 MG/ML IJ SOLN
15.0000 mg | Freq: Once | INTRAMUSCULAR | Status: DC
  Filled 2019-03-11: qty 1

## 2019-03-11 MED ORDER — PANTOPRAZOLE SODIUM 40 MG PO TBEC
40.0000 mg | DELAYED_RELEASE_TABLET | Freq: Every day | ORAL | Status: DC
  Filled 2019-03-11 (×2): qty 1

## 2019-03-11 MED ORDER — POTASSIUM CHLORIDE 10 MEQ/100ML IV SOLN
10.0000 meq | INTRAVENOUS | Status: AC
  Administered 2019-03-11 (×2): 10 meq via INTRAVENOUS
  Filled 2019-03-11: qty 100

## 2019-03-11 MED ORDER — FENTANYL CITRATE (PF) 100 MCG/2ML IJ SOLN
50.0000 ug | Freq: Once | INTRAMUSCULAR | Status: AC
  Administered 2019-03-11: 50 ug via INTRAVENOUS
  Filled 2019-03-11: qty 2

## 2019-03-11 MED ORDER — MORPHINE SULFATE (PF) 4 MG/ML IV SOLN
4.0000 mg | INTRAVENOUS | Status: DC | PRN
  Administered 2019-03-11 (×5): 4 mg via INTRAVENOUS
  Filled 2019-03-11 (×5): qty 1

## 2019-03-11 MED ORDER — SODIUM CHLORIDE 0.9 % IV SOLN
INTRAVENOUS | Status: DC
  Administered 2019-03-11: 11:00:00 via INTRAVENOUS

## 2019-03-11 MED ORDER — OXYCODONE HCL 5 MG PO TABS: 5.0000 mg | ORAL_TABLET | ORAL | Status: DC | PRN

## 2019-03-11 MED ORDER — SODIUM CHLORIDE 0.9 % IV SOLN
INTRAVENOUS | Status: DC
  Administered 2019-03-11 – 2019-03-12 (×2): via INTRAVENOUS

## 2019-03-11 MED ORDER — CLINDAMYCIN PHOSPHATE 600 MG/50ML IV SOLN
600.0000 mg | Freq: Once | INTRAVENOUS | Status: AC
  Administered 2019-03-11: 04:00:00 600 mg via INTRAVENOUS
  Filled 2019-03-11: qty 50

## 2019-03-11 MED ORDER — OXYCODONE HCL 5 MG PO TABS
10.0000 mg | ORAL_TABLET | ORAL | Status: DC | PRN
  Administered 2019-03-11: 10 mg via ORAL
  Filled 2019-03-11: qty 2

## 2019-03-11 MED ORDER — SODIUM CHLORIDE 0.9 % IV BOLUS
1000.0000 mL | Freq: Once | INTRAVENOUS | Status: AC
  Administered 2019-03-11: 1000 mL via INTRAVENOUS

## 2019-03-11 MED ORDER — TETANUS-DIPHTH-ACELL PERTUSSIS 5-2.5-18.5 LF-MCG/0.5 IM SUSP
0.5000 mL | Freq: Once | INTRAMUSCULAR | Status: AC
  Administered 2019-03-11: 0.5 mL via INTRAMUSCULAR
  Filled 2019-03-11: qty 0.5

## 2019-03-11 MED ORDER — LEVETIRACETAM IN NACL 1000 MG/100ML IV SOLN
1000.0000 mg | Freq: Once | INTRAVENOUS | Status: AC
  Administered 2019-03-11: 1000 mg via INTRAVENOUS
  Filled 2019-03-11: qty 100

## 2019-03-11 MED ORDER — ONDANSETRON HCL 4 MG/2ML IJ SOLN
4.0000 mg | Freq: Four times a day (QID) | INTRAMUSCULAR | Status: DC | PRN
  Administered 2019-03-11: 4 mg via INTRAVENOUS
  Filled 2019-03-11: qty 2

## 2019-03-11 MED ORDER — POTASSIUM CHLORIDE 10 MEQ/100ML IV SOLN
10.0000 meq | INTRAVENOUS | Status: AC
  Administered 2019-03-11 (×2): 10 meq via INTRAVENOUS
  Filled 2019-03-11 (×3): qty 100

## 2019-03-11 MED ORDER — HYDRALAZINE HCL 20 MG/ML IJ SOLN: 10.0000 mg | INTRAMUSCULAR | Status: DC | PRN

## 2019-03-11 NOTE — ED Notes (Signed)
3 unsuccessful attempts to obtain a cath urine with diffferent nurses

## 2019-03-11 NOTE — ED Notes (Signed)
The pt has a lacerated tongue  ?? From the seizure she had  No history of seizures some small amount of bleeding from her tongue  Scratches from her knees down to her feet bi-laterally no tooth damage

## 2019-03-11 NOTE — ED Notes (Signed)
bp 119/72

## 2019-03-11 NOTE — ED Notes (Signed)
Neurology at bedside.

## 2019-03-11 NOTE — ED Notes (Signed)
Many warm blankets.

## 2019-03-11 NOTE — ED Notes (Signed)
Pt woke up terrified that she didn't know where her son was.  Son was located at [employer]. Pt's child, Oswaldo Milian, is at the neighbor's house.  Theadora Rama [phone] [address].  This RN and the pt spoke with [place] and with [place], as well as Isaiah's Aunt, Jule.  Oswaldo Milian chose not to go to his aunts house, Theadora Rama felt there was a "drug problem" at the aunts house.  Pt calmed down once she spoke with child.

## 2019-03-11 NOTE — ED Triage Notes (Signed)
The pt arrived by gems see the notes placed oln the trauma section 2 ivs per ems  Given versed for combative a seizure from a fall   She arrived sleeping until she woke up a few minutes later  Moving all extremities  licid moments later

## 2019-03-11 NOTE — Progress Notes (Signed)
Pt refuses assessment, vital signs, and telemetry. Dr. Dema Severin informed.

## 2019-03-11 NOTE — ED Notes (Signed)
PT becoming increasingly beligerant, telling staff we are keeping her against her will, calling staff bitches and to get the fuck away from her.  Pt spoke with her sister and her sister told this RN that pt was not acting normal.

## 2019-03-11 NOTE — Progress Notes (Signed)
With combativeness/irritability will change keppra to depakote.   Roland Rack, MD [facility] [phone]  If 7pm- 7am, please page neurology on call as listed in [facility].

## 2019-03-11 NOTE — ED Provider Notes (Signed)
[facility] EMERGENCY DEPARTMENT Provider Note   CSN: [id number] Arrival date & time: 03/11/19  0226     History   Chief Complaint No chief complaint on file.   HPI Barbara Gilbert is a 33 y.o. female.     The history is provided by the EMS personnel. The history is limited by the condition of the patient.  Fall This is a new problem. The current episode started less than 1 hour ago. The problem occurs constantly. The problem has not changed since onset.Pertinent negatives include no chest pain and no abdominal pain. Nothing aggravates the symptoms. Nothing relieves the symptoms. She has tried nothing for the symptoms. The treatment provided no relief.  Per report patient fell off balcony 10 feet up and then went back to home and had a witnessed seizure.  Was given Versed 5 mg en route. No seizures on arrival.  Is somnolent.    History reviewed. No pertinent past medical history.  There are no active problems to display for this patient.   History reviewed. No pertinent surgical history.   OB History   No obstetric history on file.      Home Medications    Prior to Admission medications   Not on File    Family History No family history on file.  Social History Social History   Tobacco Use   Smoking status: Not on file  Substance Use Topics   Alcohol use: Not on file   Drug use: Not on file     Allergies   Patient has no allergy information on record.   Review of Systems Review of Systems  Unable to perform ROS: Acuity of condition  Constitutional: Negative for fever.  Cardiovascular: Negative for chest pain.  Gastrointestinal: Negative for abdominal pain.  Skin: Positive for wound.  Neurological: Positive for seizures.     Physical Exam Updated Vital Signs BP 109/90    Pulse (!) 119    Temp (!) 96.8 F (36 C)    Resp 18    SpO2 98%   Physical Exam Vitals signs and nursing note reviewed.  Constitutional:    Appearance: She is normal weight.  HENT:     Right Ear: Tympanic membrane normal.     Left Ear: Tympanic membrane normal.     Nose: Nose normal.     Mouth/Throat:     Mouth: Mucous membranes are moist.     Pharynx: Oropharynx is clear.  Eyes:     Conjunctiva/sclera: Conjunctivae normal.     Pupils: Pupils are equal, round, and reactive to light.  Neck:     Comments: c collar in place trachea midline Cardiovascular:     Rate and Rhythm: Regular rhythm. Tachycardia present.     Pulses: Normal pulses.     Heart sounds: Normal heart sounds.  Pulmonary:     Effort: Pulmonary effort is normal. No respiratory distress.     Breath sounds: Normal breath sounds.  Abdominal:     General: Abdomen is flat. Bowel sounds are normal.     Tenderness: There is no abdominal tenderness.     Comments: Pelvis is stable  Genitourinary:    General: Normal vulva.  Musculoskeletal: Normal range of motion.        General: No tenderness or deformity.  Skin:    General: Skin is warm and dry.     Capillary Refill: Capillary refill takes less than 2 seconds.  Neurological:     General: No focal  deficit present.      ED Treatments / Results  Labs (all labs ordered are listed, but only abnormal results are displayed) Results for orders placed or performed during the hospital encounter of 03/11/19  CDS serology  Result Value Ref Range   CDS serology specimen      SPECIMEN WILL BE HELD FOR 14 DAYS IF TESTING IS REQUIRED  Comprehensive metabolic panel  Result Value Ref Range   Sodium 135 135 - 145 mmol/L   Potassium 2.9 (L) 3.5 - 5.1 mmol/L   Chloride 105 98 - 111 mmol/L   CO2 9 (L) 22 - 32 mmol/L   Glucose, Bld 216 (H) 70 - 99 mg/dL   BUN 9 6 - 20 mg/dL   Creatinine, Ser 1.611.57 (H) 0.44 - 1.00 mg/dL   Calcium 9.2 8.9 - 09.610.3 mg/dL   Total Protein 7.1 6.5 - 8.1 g/dL   Albumin 4.3 3.5 - 5.0 g/dL   AST 34 15 - 41 U/L   ALT 15 0 - 44 U/L   Alkaline Phosphatase 66 38 - 126 U/L   Total Bilirubin 0.7  0.3 - 1.2 mg/dL   GFR calc non Af Amer 44 (L) >60 mL/min   GFR calc Af Amer 51 (L) >60 mL/min   Anion gap 21 (H) 5 - 15  CBC  Result Value Ref Range   WBC 21.4 (H) 4.0 - 10.5 K/uL   RBC 4.63 3.87 - 5.11 MIL/uL   Hemoglobin 14.5 12.0 - 15.0 g/dL   HCT 04.546.0 40.936.0 - 81.146.0 %   MCV 99.4 80.0 - 100.0 fL   MCH 31.3 26.0 - 34.0 pg   MCHC 31.5 30.0 - 36.0 g/dL   RDW 91.412.4 78.211.5 - 95.615.5 %   Platelets 342 150 - 400 K/uL   nRBC 0.0 0.0 - 0.2 %  Ethanol  Result Value Ref Range   Alcohol, Ethyl (B) <10 <10 mg/dL  Lactic acid, plasma  Result Value Ref Range   Lactic Acid, Venous >11.0 (HH) 0.5 - 1.9 mmol/L  Protime-INR  Result Value Ref Range   Prothrombin Time 15.4 (H) 11.4 - 15.2 seconds   INR 1.2 0.8 - 1.2  I-stat chem 8, ED  Result Value Ref Range   Sodium 138 135 - 145 mmol/L   Potassium 2.9 (L) 3.5 - 5.1 mmol/L   Chloride 105 98 - 111 mmol/L   BUN 8 6 - 20 mg/dL   Creatinine, Ser 2.131.30 (H) 0.44 - 1.00 mg/dL   Glucose, Bld 086203 (H) 70 - 99 mg/dL   Calcium, Ion 5.781.17 4.691.15 - 1.40 mmol/L   TCO2 9 (L) 22 - 32 mmol/L   Hemoglobin 16.3 (H) 12.0 - 15.0 g/dL   HCT 62.948.0 (H) 52.836.0 - 41.346.0 %  I-Stat Beta hCG blood, ED ([facility], [facility], [facility] only)  Result Value Ref Range   I-stat hCG, quantitative <5.0 <5 mIU/mL   Comment 3          Sample to Blood Bank  Result Value Ref Range   Blood Bank Specimen SAMPLE AVAILABLE FOR TESTING    Sample Expiration      03/12/2019,2359 Performed at [facility], [address] [place], [place] [id number]    Ct Head Wo Contrast  Result Date: 03/11/2019 CLINICAL DATA:  Fall and possible seizure EXAM: CT HEAD WITHOUT CONTRAST CT CERVICAL SPINE WITHOUT CONTRAST TECHNIQUE: Multidetector CT imaging of the head and cervical spine was performed following the standard protocol without intravenous contrast. Multiplanar CT image  reconstructions of the cervical spine were also generated. COMPARISON:  None. FINDINGS: CT HEAD FINDINGS Brain: There is no mass, hemorrhage or  extra-axial collection. The size and configuration of the ventricles and extra-axial CSF spaces are normal. The brain parenchyma is normal, without evidence of acute or chronic infarction. Vascular: No abnormal hyperdensity of the major intracranial arteries or dural venous sinuses. No intracranial atherosclerosis. Skull: Small right parietal scalp hematoma.  No skull fracture. Sinuses/Orbits: No fluid levels or advanced mucosal thickening of the visualized paranasal sinuses. No mastoid or middle ear effusion. The orbits are normal. CT CERVICAL SPINE FINDINGS Alignment: No static subluxation. Facets are aligned. Occipital condyles are normally positioned. Skull base and vertebrae: No acute fracture. Soft tissues and spinal canal: No prevertebral fluid or swelling. No visible canal hematoma. Disc levels: No advanced spinal canal or neural foraminal stenosis. Upper chest: No pneumothorax, pulmonary nodule or pleural effusion. Other: Enlarged right thyroid lobe with multiple hypodense nodules. IMPRESSION: 1. No acute intracranial abnormality. 2. Small right parietal scalp hematoma without skull fracture. 3. No acute fracture or static subluxation of the cervical spine. 4. Enlarged right thyroid lobe with multiple hypodense nodules. Nonemergent thyroid ultrasound recommended for further characterization on an outpatient basis. Electronically Signed   By: Deatra RobinsonKevin  Herman M.D.   On: 03/11/2019 03:32   Ct Chest W Contrast  Result Date: 03/11/2019 CLINICAL DATA:  Recent fall from balcony with subsequent seizure activity EXAM: CT CHEST, ABDOMEN, AND PELVIS WITH CONTRAST TECHNIQUE: Multidetector CT imaging of the chest, abdomen and pelvis was performed following the standard protocol during bolus administration of intravenous contrast. CONTRAST:  100mL OMNIPAQUE IOHEXOL 300 MG/ML  SOLN COMPARISON:  11/30/2017 FINDINGS: CT CHEST FINDINGS Cardiovascular: Thoracic aorta is within normal limits. No aneurysmal dilatation is seen.  No cardiac enlargement is noted. Visualized pulmonary artery shows no central pulmonary embolus. No coronary calcifications are seen. Mediastinum/Nodes: Thoracic inlet demonstrates enlargement of the right lobe of the thyroid with a dominant 2.1 cm hypodense nodule. No hilar or mediastinal adenopathy is noted. The esophagus is within normal limits. Lungs/Pleura: Lungs are clear. No pleural effusion or pneumothorax. Musculoskeletal: No acute bony abnormality is noted. CT ABDOMEN PELVIS FINDINGS Hepatobiliary: No focal liver abnormality is seen. No gallstones, gallbladder wall thickening, or biliary dilatation. Pancreas: Unremarkable. No pancreatic ductal dilatation or surrounding inflammatory changes. Spleen: Normal in size without focal abnormality. Adrenals/Urinary Tract: Adrenal glands are within normal limits bilaterally. Kidneys demonstrate no renal calculi or obstructive changes. Delayed images demonstrate normal excretion of contrast material. The ureters are within normal limits. The bladder is well distended. Stomach/Bowel: Mild diverticular change of the colon is noted without evidence of diverticulitis. The appendix is well visualized and within normal limits. No obstructive or inflammatory changes of the small bowel are seen. The stomach is within normal limits. Vascular/Lymphatic: No significant vascular findings are present. No enlarged abdominal or pelvic lymph nodes. Reproductive: Uterus is retroflexed. No ovarian mass lesion is seen. Changes of prior tubal ligation are noted. Other: No abdominal wall hernia or abnormality. No abdominopelvic ascites. Musculoskeletal: No acute or significant osseous findings. IMPRESSION: Large right-sided hypodense thyroid nodule. This is stable from a prior CT examination from 11/30/2017. Diverticulosis without diverticulitis. No acute posttraumatic abnormality is noted. Electronically Signed   By: Alcide CleverMark  Lukens M.D.   On: 03/11/2019 03:42   Ct Cervical Spine Wo  Contrast  Result Date: 03/11/2019 CLINICAL DATA:  Fall and possible seizure EXAM: CT HEAD WITHOUT CONTRAST CT CERVICAL SPINE WITHOUT CONTRAST TECHNIQUE: Multidetector CT  imaging of the head and cervical spine was performed following the standard protocol without intravenous contrast. Multiplanar CT image reconstructions of the cervical spine were also generated. COMPARISON:  None. FINDINGS: CT HEAD FINDINGS Brain: There is no mass, hemorrhage or extra-axial collection. The size and configuration of the ventricles and extra-axial CSF spaces are normal. The brain parenchyma is normal, without evidence of acute or chronic infarction. Vascular: No abnormal hyperdensity of the major intracranial arteries or dural venous sinuses. No intracranial atherosclerosis. Skull: Small right parietal scalp hematoma.  No skull fracture. Sinuses/Orbits: No fluid levels or advanced mucosal thickening of the visualized paranasal sinuses. No mastoid or middle ear effusion. The orbits are normal. CT CERVICAL SPINE FINDINGS Alignment: No static subluxation. Facets are aligned. Occipital condyles are normally positioned. Skull base and vertebrae: No acute fracture. Soft tissues and spinal canal: No prevertebral fluid or swelling. No visible canal hematoma. Disc levels: No advanced spinal canal or neural foraminal stenosis. Upper chest: No pneumothorax, pulmonary nodule or pleural effusion. Other: Enlarged right thyroid lobe with multiple hypodense nodules. IMPRESSION: 1. No acute intracranial abnormality. 2. Small right parietal scalp hematoma without skull fracture. 3. No acute fracture or static subluxation of the cervical spine. 4. Enlarged right thyroid lobe with multiple hypodense nodules. Nonemergent thyroid ultrasound recommended for further characterization on an outpatient basis. Electronically Signed   By: Ulyses Jarred M.D.   On: 03/11/2019 03:32   Ct Abdomen Pelvis W Contrast  Result Date: 03/11/2019 CLINICAL DATA:  Recent  fall from balcony with subsequent seizure activity EXAM: CT CHEST, ABDOMEN, AND PELVIS WITH CONTRAST TECHNIQUE: Multidetector CT imaging of the chest, abdomen and pelvis was performed following the standard protocol during bolus administration of intravenous contrast. CONTRAST:  175mL OMNIPAQUE IOHEXOL 300 MG/ML  SOLN COMPARISON:  11/30/2017 FINDINGS: CT CHEST FINDINGS Cardiovascular: Thoracic aorta is within normal limits. No aneurysmal dilatation is seen. No cardiac enlargement is noted. Visualized pulmonary artery shows no central pulmonary embolus. No coronary calcifications are seen. Mediastinum/Nodes: Thoracic inlet demonstrates enlargement of the right lobe of the thyroid with a dominant 2.1 cm hypodense nodule. No hilar or mediastinal adenopathy is noted. The esophagus is within normal limits. Lungs/Pleura: Lungs are clear. No pleural effusion or pneumothorax. Musculoskeletal: No acute bony abnormality is noted. CT ABDOMEN PELVIS FINDINGS Hepatobiliary: No focal liver abnormality is seen. No gallstones, gallbladder wall thickening, or biliary dilatation. Pancreas: Unremarkable. No pancreatic ductal dilatation or surrounding inflammatory changes. Spleen: Normal in size without focal abnormality. Adrenals/Urinary Tract: Adrenal glands are within normal limits bilaterally. Kidneys demonstrate no renal calculi or obstructive changes. Delayed images demonstrate normal excretion of contrast material. The ureters are within normal limits. The bladder is well distended. Stomach/Bowel: Mild diverticular change of the colon is noted without evidence of diverticulitis. The appendix is well visualized and within normal limits. No obstructive or inflammatory changes of the small bowel are seen. The stomach is within normal limits. Vascular/Lymphatic: No significant vascular findings are present. No enlarged abdominal or pelvic lymph nodes. Reproductive: Uterus is retroflexed. No ovarian mass lesion is seen. Changes of  prior tubal ligation are noted. Other: No abdominal wall hernia or abnormality. No abdominopelvic ascites. Musculoskeletal: No acute or significant osseous findings. IMPRESSION: Large right-sided hypodense thyroid nodule. This is stable from a prior CT examination from 11/30/2017. Diverticulosis without diverticulitis. No acute posttraumatic abnormality is noted. Electronically Signed   By: Inez Catalina M.D.   On: 03/11/2019 03:42   Dg Chest Portable 1 View  Result Date: 03/11/2019 CLINICAL  DATA:  Recent fall with chest pain, initial encounter EXAM: PORTABLE CHEST 1 VIEW COMPARISON:  None. FINDINGS: Cardiac shadows within normal limits. The lungs are well aerated bilaterally. Bilateral nipple shadows are noted. No focal infiltrate or sizable effusion is seen. No acute fracture is noted. IMPRESSION: No active disease. Electronically Signed   By: Alcide CleverMark  Lukens M.D.   On: 03/11/2019 02:43    EKG EKG Interpretation  Date/Time:  Monday March 11 2019 02:33:31 EDT Ventricular Rate:  114 PR Interval:    QRS Duration: 85 QT Interval:  331 QTC Calculation: 456 R Axis:   89 Text Interpretation:  Sinus tachycardia LAE, consider biatrial enlargement Confirmed by Denney Shein ([id number]) on 03/11/2019 2:47:29 AM   Radiology Dg Chest Portable 1 View  Result Date: 03/11/2019 CLINICAL DATA:  Recent fall with chest pain, initial encounter EXAM: PORTABLE CHEST 1 VIEW COMPARISON:  None. FINDINGS: Cardiac shadows within normal limits. The lungs are well aerated bilaterally. Bilateral nipple shadows are noted. No focal infiltrate or sizable effusion is seen. No acute fracture is noted. IMPRESSION: No active disease. Electronically Signed   By: Alcide CleverMark  Lukens M.D.   On: 03/11/2019 02:43    Procedures .Marland Kitchen.Laceration Repair  Date/Time: 03/11/2019 4:10 AM Performed by: Cy BlamerPalumbo, Magalie Almon, MD Authorized by: Cy BlamerPalumbo, Obediah Welles, MD   Consent:    Consent obtained:  Verbal   Consent given by:  Patient   Risks discussed:   Infection, need for additional repair and nerve damage   Alternatives discussed:  No treatment Anesthesia (see MAR for exact dosages):    Anesthesia method:  None Laceration details:    Location:  Scalp   Scalp location:  R parietal   Length (cm):  1   Depth (mm):  1 Repair type:    Repair type:  Simple Pre-procedure details:    Preparation:  Patient was prepped and draped in usual sterile fashion Exploration:    Hemostasis achieved with:  Direct pressure   Wound exploration: wound explored through full range of motion     Wound extent: no areolar tissue violation noted     Contaminated: no   Treatment:    Area cleansed with:  Betadine   Amount of cleaning:  Standard Skin repair:    Repair method:  Staples Approximation:    Approximation:  Loose Post-procedure details:    Dressing:  Open (no dressing)   Patient tolerance of procedure:  Tolerated well, no immediate complications   (including critical care time)  Medications Ordered in ED Medications  sodium chloride 0.9 % bolus 1,000 mL (1,000 mLs Intravenous New Bag/Given 03/11/19 0329)    And  0.9 %  sodium chloride infusion (has no administration in time range)  Tdap (BOOSTRIX) injection 0.5 mL (has no administration in time range)  clindamycin (CLEOCIN) IVPB 600 mg (600 mg Intravenous New Bag/Given 03/11/19 0413)  potassium chloride 10 mEq in 100 mL IVPB (has no administration in time range)  0.9 %  sodium chloride infusion (has no administration in time range)  levETIRAcetam (KEPPRA) IVPB 1000 mg/100 mL premix (1,000 mg Intravenous New Bag/Given 03/11/19 0428)  iohexol (OMNIPAQUE) 300 MG/ML solution 100 mL (100 mLs Intravenous Contrast Given 03/11/19 0321)  fentaNYL (SUBLIMAZE) injection 50 mcg (50 mcg Intravenous Given 03/11/19 0418)   Case d/w Dr. Wilford CornerArora, MRI and EEG in the AM, please start keppra IV abd then BID for several days.  Consult if positive     Patient reported arm numbness but on EXam patient is 5/5 and has  intact sensation to confrontation.  Will leave the collar on and obtain MRI of the head and Cspine   420 Case d/w Dr. Janee Morn of trauma who will admit the patient    Final Clinical Impressions(s) / ED Diagnoses      Antonious Omahoney, MD 03/11/19 5784

## 2019-03-11 NOTE — ED Notes (Signed)
Pt to c-t at 0250  And returned 0325

## 2019-03-11 NOTE — ED Notes (Signed)
Pain med given 

## 2019-03-11 NOTE — Progress Notes (Signed)
RN has attempted to put PT on telemetry and pt refuses , constantly cursing and pushing back. RN explained the importance of having the monitor and pt still continues to cover herself up and continues to curse and scream.
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
## 2019-03-11 NOTE — ED Notes (Signed)
I attempted to do a neuro chest  The pt has been medicated  Unable to follow commands she does not know what day of the week and the month  She does know the year and the president she cannot follow instructions as far as moving her arms and legs  She does move both arms and legs whenever she wants
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
## 2019-03-11 NOTE — ED Notes (Signed)
Pt c/o back pain

## 2019-03-11 NOTE — ED Notes (Signed)
MRI called and notified that pt is calm enough for MRI.
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
## 2019-03-11 NOTE — ED Notes (Signed)
Prior to arrival after she was given versed iv 5mg  by gems

## 2019-03-11 NOTE — ED Notes (Signed)
Sister, Barbara Gilbert, [phone] is the go to family member to communicate with.
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
## 2019-03-11 NOTE — ED Notes (Signed)
The pts pain med is lasting approx 65 minutes  Then she is  Having 10/10 pain   The pt has asked about her 31 yr old son  A neighbor had taken him home with her earlier.  The pt has her phone and a comforter ems wrapped her in at her house.  Clothes cut off

## 2019-03-11 NOTE — ED Notes (Signed)
PT calm.  Able to follow commands and allow staff to in-and-out cath, however, she still states she is in the "copital", not hospital.

## 2019-03-11 NOTE — ED Notes (Signed)
Urine Culture send  with urine sample  
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
## 2019-03-11 NOTE — Progress Notes (Signed)
New Admission Note: Patient verbally abusive, refusing care, and medical interventions.  Arrival Method: Stretcher from ED Mental Orientation: Alert and oriented to person and time. Telemetry: Pt has order for telemetry but patient is refusing care. Assessment: Completed Skin: Pt. Has abrasions to left and right feet, toes, ankles, fingers on the left hand and to her bottom lip. IV: Left hand  Pain: Patient shouting out that she is in pain but unable to state a number on a scale of 1-10. Safety Measures: Safety Fall Prevention Plan was given, discussed.  3W: Patient has been orientated to the room, unit and the staff.  Family: Sister updated on pt current condition.  Orders have been reviewed and implemented. Will continue to monitor the patient. Call light has been placed within reach and bed alarm has been activated.   Janus Molder ,RN

## 2019-03-12 DIAGNOSIS — R561 Post traumatic seizures: Secondary | ICD-10-CM

## 2019-03-12 LAB — HIV ANTIBODY (ROUTINE TESTING W REFLEX): HIV Screen 4th Generation wRfx: NONREACTIVE

## 2019-03-12 MED ORDER — DIVALPROEX SODIUM 250 MG PO DR TAB: 500.0000 mg | DELAYED_RELEASE_TABLET | Freq: Two times a day (BID) | ORAL | 0 refills | Status: DC

## 2019-03-12 MED ORDER — OXYCODONE HCL 5 MG PO TABS: 5.0000 mg | ORAL_TABLET | Freq: Four times a day (QID) | ORAL | 0 refills | Status: DC | PRN

## 2019-03-12 NOTE — Plan of Care (Signed)

## 2019-03-12 NOTE — Progress Notes (Signed)
Subjective: Much improved, still does not remember much of yesterday  Exam: Vitals:   03/12/19 0900 03/12/19 1108  BP: 120/79 110/83  Pulse: 72 75  Resp: 18 18  Temp: 99 F (37.2 C) 98.7 F (37.1 C)  SpO2: 100% 100%   Gen: In bed, NAD Resp: non-labored breathing, no acute distress Abd: soft, nt  Neuro: MS: Awake, alert, oriented and appropriate CN: Pupils equal round and reactive to light, extraocular movements intact Motor: Strength is full Sensory: Intact light touch  Impression: 33 year old female with postconcussive confusion yesterday, now greatly improved.  At this point, I do not have any significant concerns about her capacity.  She is on Depakote and I would continue this for 1 week given that she has immediate peri-traumatic event seizure.  Recommendations: 1) Depakote 500 mg twice daily 2) outpatient follow-up with neurology  Roland Rack, MD [facility] [phone]  If 7pm- 7am, please page neurology on call as listed in [place].

## 2019-03-12 NOTE — Discharge Instructions (Signed)
May shower.  Do not submerge head in water for long periods of time such as swimming for at least 2 weeks until your head laceration is healed.  Laceration Care, Adult (scalp) A laceration is a cut that may go through all layers of the skin. The cut may also go into the tissue that is right under the skin. Some cuts heal on their own. Others need to be closed with stitches (sutures), staples, skin adhesive strips, or skin glue. Taking care of your injury lowers your risk of infection, helps your injury to heal better, and may prevent scarring. Supplies needed:  Soap.  Water.  Hand sanitizer.  Bandage (dressing).  Antibiotic ointment.  Clean towel. How to take care of your cut Wash your hands with soap and water before touching your wound or changing your bandage. If soap and water are not available, use hand sanitizer. If your doctor used stitches or staples:  Keep the wound clean and dry.  If you were given a bandage, change it at least once a day as told by your doctor. You should also change it if it gets wet or dirty.  Keep the wound completely dry for the first 24 hours, or as told by your doctor. After that, you may take a shower or a bath. Do not get the wound soaked in water until after the stitches or staples have been removed.  Clean the wound once a day, or as told by your doctor: ? Wash the wound with soap and water. ? Rinse the wound with water to remove all soap. ? Pat the wound dry with a clean towel. Do not rub the wound.  After you clean the wound, put a thin layer of antibiotic ointment on it as told by your doctor. This ointment: ? Helps to prevent infection. ? Keeps the bandage from sticking to the wound.  Have your stitches or staples removed as told by your doctor. If your doctor used skin adhesive strips:  Keep the wound clean and dry.  If you were given a bandage, you should change it at least once a day as told by your doctor. You should also change  it if it gets wet or dirty.  Do not get the skin adhesive strips wet. You can take a shower or a bath, but keep the wound dry.  If the wound gets wet, pat it dry with a clean towel. Do not rub the wound.  Skin adhesive strips fall off on their own. You can trim the strips as the wound heals. Do not remove any strips that are still stuck to the wound. They will fall off after a while. If your doctor used skin glue:  Try to keep your wound dry, but you may briefly wet it in the shower or bath. Do not soak the wound in water, such as by swimming.  After you take a shower or a bath, gently pat the wound dry with a clean towel. Do not rub the wound.  Do not do any activities that will make you really sweaty until the skin glue has fallen off on its own.  Do not apply liquid, cream, or ointment medicine to your wound while the skin glue is still on.  If you were given a bandage, you should change it at least once a day or as told by your doctor. You should also change it if it gets dirty or wet.  If a bandage is placed over the wound, do not  let the tape touch the skin glue.  Do not pick at the glue. The skin glue usually stays on for 5-10 days. Then, it falls off the skin. General instructions   Take over-the-counter and prescription medicines only as told by your doctor.  If you were given antibiotic medicine or ointment, take or apply it as told by your doctor. Do not stop using it even if your condition improves.  Do not scratch or pick at the wound.  Check your wound every day for signs of infection. Watch for: ? Redness, swelling, or pain. ? Fluid, blood, or pus.  Raise (elevate) the injured area above the level of your heart while you are sitting or lying down.  If directed, put ice on the affected area: ? Put ice in a plastic bag. ? Place a towel between your skin and the bag. ? Leave the ice on for 20 minutes, 2-3 times a day.  Prevent scarring by covering your wound with  sunscreen of at least 30 SPF whenever you are outside after your wound has healed.  Keep all follow-up visits as told by your doctor. This is important. Get help if:  You got a tetanus shot and you have any of these problems at the injection site: ? Swelling. ? Very bad pain. ? Redness. ? Bleeding.  You have a fever.  A wound that was closed breaks open.  You notice a bad smell coming from your wound or your bandage.  You notice something coming out of the wound, such as wood or glass.  Medicine does not relieve your pain.  You have more redness, swelling, or pain at the site of your wound.  You have fluid, blood, or pus coming from your wound.  You notice a change in the color of your skin near your wound.  You need to change the bandage often because fluid, blood, or pus is coming from the wound.  You start to have a new rash.  You start to have numbness around the wound. Get help right away if:  You have very bad swelling around the wound.  Your pain suddenly gets worse and is very bad.  You notice painful lumps near the wound or anywhere on your body.  You have a red streak going away from your wound.  The wound is on your hand or foot, and: ? You cannot move a finger or toe. ? Your fingers or toes look pale or bluish. Summary  A laceration is a cut that may go through all layers of the skin. The cut may also go into the tissue right under the skin.  Some cuts heal on their own. Others need to be closed with stitches, staples, skin adhesive strips, or skin glue.  Follow your doctor's instructions for caring for your cut. Proper care of a cut lowers the risk of infection, helps the cut heal better, and prevents scarring. This information is not intended to replace advice given to you by your health care provider. Make sure you discuss any questions you have with your health care provider. Document Released: 01/11/2008 Document Revised: 09/22/2017 Document  Reviewed: 08/14/2017 Elsevier Patient Education  2020 [employer].

## 2019-03-12 NOTE — Progress Notes (Signed)
Patient cursing at RN, Refuses all morning meds, demanding to leave AMA, verbally challenging/agressive

## 2019-03-12 NOTE — Progress Notes (Signed)
OT Cancellation Note  Patient Details Name: Barbara Gilbert MRN: [id number] DOB: 1985/10/03   Cancelled Treatment:    Reason Eval/Treat Not Completed: Other (comment) OT order received, pt chart reviewed. Per RN pt is very combative refusing all care at this time, requesting OT/PT hold evaluations. Awaiting psychiatry consult. Will continue to follow as available and appropriate to initiate OT POC.  Zenovia Jarred, MSOT, OTR/L Behavioral Health OT/ [facility] Office: [phone]  Zenovia Jarred 03/12/2019, 8:51 AM

## 2019-03-12 NOTE — Discharge Summary (Signed)
Patient ID: Barbara Gilbert Barbara Gilbert [id number] 1986/01/15 33 y.o.  Admit date: 03/11/2019 Discharge date: 03/12/2019  Admitting Diagnosis: Fall Post traumatic seizure Scalp laceration  Discharge Diagnosis Patient Active Problem List   Diagnosis Date Noted   Post traumatic seizure (HCC) 03/11/2019  Fall Post traumatic seizure Scalp laceration Post concussive behavior changes, improved  Consultants Dr. Amada JupiterKirkpatrick, neurology  Reason for Admission: 32yo F fell off a balcony. Unknown LOC.  She reportedly crawled back inside of her apartment where a minor child witnessed her having seizure-like activity and called 911.  She received Versed during transport by EMS.  She was brought in as a level 2 trauma.  Work-up in the emergency department revealed a posterior scalp laceration and no other injuries identified.  She has complained of numbness in bilateral upper extremities.  She was given Keppra and neurology was consulted.  I was called for admission.  She is sleepy at this time and has received medication for pain.  She is not a good historian.  Procedures none  Hospital Course:  The patient was admitted for observation and further work up of her complaint of numb UEs as well as neurologic evaluation given her post traumatic seizure.  CT head was negative, and further work up of MRI head and c-spine were also negative for any acute events.  She was able to reason and hold a conversation on day of admission but was acting out, hitting, cussing, and having some hallucinations per neurology's note when they saw her and from RN notes. She was initially placed on Keppra for her seizure, but transitioned to Depakote.  She will be DC home on 500mg  BID for 7 days.  She was given some ativan and haldol.  On HD, she was much improved.  She was cooperative, no longer cussing, or being aggressive towards staff.  Initially a psych consult was placed, but given her improvement on HD 1, this was no longer felt  necessary.  She was otherwise doing well and tolerating a diet.  Pain was controlled and she was stable for DC home with outpatient neuro follow up.  She will also follow up with her PCP for scalp laceration staple removal as well as follow up for the large thyroid nodule she was noted to have on her initial scans.   Physical Exam: Gen: NAD Head: staples in place.  Tender on right side of head. EENT: stable, no injuries.  c-collar removed as neck cleared with no midline bony tenderness.  Good ROM with no pain of neck Heart: regular Lungs: CTAB Abd: soft, NT, ND, +BS Ext: NVI with no acute injuries noted  Allergies as of 03/12/2019   No Known Allergies     Medication List    TAKE these medications   divalproex 250 MG DR tablet Commonly known as: Depakote Take 2 tablets (500 mg total) by mouth 2 (two) times daily for 7 days.   metroNIDAZOLE 500 MG tablet Commonly known as: FLAGYL Take 500 mg by mouth 2 (two) times daily.   oxyCODONE 5 MG immediate release tablet Commonly known as: Oxy IR/ROXICODONE Take 1 tablet (5 mg total) by mouth every 6 (six) hours as needed for moderate pain.        Follow-up Information    [facility]. Schedule an appointment as soon as possible for a visit in 1 week(s).   Specialty: Internal Medicine Why: you need an outpatient thyroid ultrasound to evaluate nodule seen on CT please ask your doctor to  order this.  You will also need to follow up in 1 week to get staples/sutures removed from your head Contact information: [address] [place] [address] [phone]        neurology office Follow up.   Why: referral is being made.  They will contact you for follow up          Signed: Saverio Danker, [facility] 03/12/2019, 11:48 AM Pager: [phone]

## 2019-03-12 NOTE — Progress Notes (Signed)
Admission documentation not completed this shift d/t pt not cooperating with staff enough to gather needed information. Pt AOx4 with moments of forgetfulness and confusion.

## 2019-03-12 NOTE — Progress Notes (Signed)
PT Cancellation Note  Patient Details Name: Hollyn Stucky MRN: [id number] DOB: 1986-04-10   Cancelled Treatment:    Reason Eval/Treat Not Completed: Patient not medically ready  Patient aggressive, refusing all treatment. Spoke with Gaspar Bidding, RN and he recommended PT defer evaluation at this time. Awaiting psychiatry consult.   Rexanne Mano, PT 03/12/2019, 8:36 AM

## 2019-03-12 NOTE — Progress Notes (Signed)
patient discharged home with family, patient still refusing treatment

## 2019-03-28 ENCOUNTER — Encounter: Payer: Self-pay | Admitting: Neurology

## 2019-05-02 ENCOUNTER — Other Ambulatory Visit: Payer: Self-pay | Admitting: Surgery

## 2019-05-02 DIAGNOSIS — E041 Nontoxic single thyroid nodule: Secondary | ICD-10-CM

## 2019-05-03 ENCOUNTER — Other Ambulatory Visit: Payer: Self-pay | Admitting: Surgery

## 2019-05-03 ENCOUNTER — Ambulatory Visit
Admission: RE | Admit: 2019-05-03 | Discharge: 2019-05-03 | Disposition: A | Discharge status: Home / Self Care | Payer: Self-pay | Source: Ambulatory Visit | Attending: Surgery | Admitting: Surgery

## 2019-05-03 DIAGNOSIS — E041 Nontoxic single thyroid nodule: Secondary | ICD-10-CM

## 2019-05-17 ENCOUNTER — Encounter: Payer: Self-pay | Admitting: Neurology

## 2019-05-17 ENCOUNTER — Other Ambulatory Visit (INDEPENDENT_AMBULATORY_CARE_PROVIDER_SITE_OTHER): Payer: Medicaid Other

## 2019-05-17 ENCOUNTER — Other Ambulatory Visit: Payer: Self-pay

## 2019-05-17 ENCOUNTER — Ambulatory Visit (INDEPENDENT_AMBULATORY_CARE_PROVIDER_SITE_OTHER): Payer: Medicaid Other | Admitting: Neurology

## 2019-05-17 VITALS — BP 115/76 | HR 89 | Ht 60.0 in | Wt 139.5 lb

## 2019-05-17 DIAGNOSIS — R569 Unspecified convulsions: Secondary | ICD-10-CM | POA: Diagnosis not present

## 2019-05-17 DIAGNOSIS — R413 Other amnesia: Secondary | ICD-10-CM

## 2019-05-17 NOTE — Progress Notes (Signed)
NEUROLOGY CONSULTATION NOTE  Barbara Gilbert MRN: [id number] DOB: 30-Mar-1986  Referring provider: Dr. Nolene Ebbs Primary care provider: Dr. Nolene Ebbs  Reason for consult:  New onset seizure  Dear Dr Jeanie Cooks:  Thank you for your kind referral of Barbara Gilbert for consultation of the above symptoms. Although her history is well known to you, please allow me to reiterate it for the purpose of our medical record. She is alone in the office today. Records and images were personally reviewed where available.  HISTORY OF PRESENT ILLNESS: This is a very pleasant 33 year old right-handed woman with a history of anxiety, PTSD, presenting for evaluation of new onset seizure. She was in her usual state of health until 03/11/2019, she recalls looking at the full moon on her porch then has no recollection of events until she was in the hospital. She was alone and apparently fell over the railing then walked back into the house. There was a blood trail on the ground. Her son said she walked into the house and barricaded the door with a chair (which she has always done). She was not answering her questions, sat down next to him and was in a daze, not looking at him. She then had a convulsion and was agitated on EMS arrival, requiring 5mg  Versed. She had lacerated her tongue and had scratches down her knees. In the ER she was confused, wanting to go home and calling people "mom" in the ER. 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 She was back to baseline the next day with no recollection of events. I personally reviewed MRI brain without contrast which was normal, hippocampi symmetric without abnormal signal. Bloodwork showed a WBC of 21.4, creatinine 1.30,  K 2.9. UDS positive for opiates and benzodiazepine. EtOH negative. She was discharged on a week of Depakote and has not been taking any medication since with no recurrence of any convulsive activity. She has noticed episodes of  zoning out. She denies any olfactory/gustatory hallucinations, deja vu, rising epigastric sensation, focal numbness/tingling/weakness, myoclonic jerks. She denies any headaches, dizziness, vision changes. She has noticed that things have changed since then, she has a 15 year history of smoking and suddenly does not care to smoke cigarettes anymore. She has noticed a little memory loss, she does not recall events from a month prior. She denies missing bills. She does not take any regular medications. She used to suffer from anxiety but does not have it like she used to. She has not been working. She has right shoulder and wrist pain. She has neck and back pain. She does not drink alcohol.   Epilepsy Risk Factors:  She had a normal birth and early development.  There is no history of febrile convulsions, CNS infections such as meningitis/encephalitis, significant traumatic brain injury, neurosurgical procedures, or family history of seizures.   PAST MEDICAL HISTORY: Past Medical History:  Diagnosis Date   Anxiety attack    Panic disorder    PTSD (post-traumatic stress disorder)    Held hostage while pregnant as a teen    PAST SURGICAL HISTORY: Past Surgical History:  Procedure Laterality Date   abortions     X 2----2012 and 2014   LAPAROSCOPIC TUBAL LIGATION Bilateral 10/04/2017   Procedure: LAPAROSCOPIC TUBAL LIGATION WITH FILSHIE CLIPS;  Surgeon: Woodroe Mode, MD;  Location: [facility];  Service: Gynecology;  Laterality: Bilateral;    MEDICATIONS: Current Outpatient Medications on File  Prior to Visit  Medication Sig Dispense Refill   cyclobenzaprine (FLEXERIL) 5 MG tablet Take 5 mg by mouth as needed.     No current facility-administered medications on file prior to visit.     ALLERGIES: No Known Allergies  FAMILY HISTORY: Family History  Problem Relation Age of Onset   Fibromyalgia Mother    Mental illness Mother    Gout Father    Asthma Sister      SOCIAL HISTORY: Social History   Socioeconomic History   Marital status: Single    Spouse name: Not on file   Number of children: Not on file   Years of education: Not on file   Highest education level: Not on file  Occupational History   Not on file  Social Needs   Financial resource strain: Not on file   Food insecurity    Worry: Not on file    Inability: Not on file   Transportation needs    Medical: Not on file    Non-medical: Not on file  Tobacco Use   Smoking status: Current Every Day Smoker    Packs/day: 0.30    Start date: 2009   Smokeless tobacco: Never Used  Substance and Sexual Activity   Alcohol use: No   Drug use: Yes    Types: Marijuana    Comment: 1 week ago   Sexual activity: Yes    Partners: Male    Birth control/protection: None  Lifestyle   Physical activity    Days per week: Not on file    Minutes per session: Not on file   Stress: Not on file  Relationships   Social connections    Talks on phone: Not on file    Gets together: Not on file    Attends religious service: Not on file    Active member of club or organization: Not on file    Attends meetings of clubs or organizations: Not on file    Relationship status: Not on file   Intimate partner violence    Fear of current or ex partner: Not on file    Emotionally abused: Not on file    Physically abused: Not on file    Forced sexual activity: Not on file  Other Topics Concern   Not on file  Social History Narrative   Right handed      Completed HS      Lives with son        REVIEW OF SYSTEMS: Constitutional: No fevers, chills, or sweats, no generalized fatigue, change in appetite Eyes: No visual changes, double vision, eye pain Ear, nose and throat: No hearing loss, ear pain, nasal congestion, sore throat Cardiovascular: No chest pain, palpitations Respiratory:  No shortness of breath at rest or with exertion, wheezes GastrointestinaI: No nausea, vomiting,  diarrhea, abdominal pain, fecal incontinence Genitourinary:  No dysuria, urinary retention or frequency Musculoskeletal:  + neck pain, back pain Integumentary: No rash, pruritus, skin lesions Neurological: as above Psychiatric: No depression, insomnia, anxiety Endocrine: No palpitations, fatigue, diaphoresis, mood swings, change in appetite, change in weight, increased thirst Hematologic/Lymphatic:  No anemia, purpura, petechiae. Allergic/Immunologic: no itchy/runny eyes, nasal congestion, recent allergic reactions, rashes  PHYSICAL EXAM: Vitals:   05/17/19 1037  BP: 115/76  Pulse: 89  SpO2: 100%   General: No acute distress Head:  Normocephalic, healed scars on right side Skin/Extremities: No rash, no edema Neurological Exam: Mental status: alert and oriented to person, place, and time, no dysarthria or aphasia,  Fund of knowledge is appropriate.  Recent and remote memory are intact. 2/3 delayed recall.  Attention and concentration are normal. WORLD 4/5 (DLOW).  Able to name objects and repeat phrases. Cranial nerves: CN I: not tested CN II: pupils equal, round and reactive to light, visual fields intact CN III, IV, VI:  full range of motion, no nystagmus, no ptosis CN V: facial sensation intact CN VII: upper and lower face symmetric CN VIII: hearing intact to conversation CN IX, X: gag intact, uvula midline CN XI: sternocleidomastoid and trapezius muscles intact CN XII: tongue midline Bulk & Tone: normal, no fasciculations. Motor: 5/5 throughout with no pronator drift. Sensation: intact to light touch, cold, pin, vibration and joint position sense.  No extinction to double simultaneous stimulation.  Romberg test negative Deep Tendon Reflexes: +2 throughout, no ankle clonus Plantar responses: downgoing bilaterally Cerebellar: no incoordination on finger to nose testing Gait: narrow-based and steady, able to tandem walk adequately. Tremor: none  IMPRESSION: This is a very  pleasant 33 year old right-handed woman with a history of anxiety, PTSD, presenting after a witnessed convulsion preceded by staring/unresponsiveness last 03/11/2019. This occurred after a fall down her porch requiring several stitches. Unclear if the initial fall was due to a seizure, or if the seizure was an acute post-traumatic seizure. MRI brain normal. She has noticed zoning out and memory loss since then. A 1-hour EEG will be ordered to assess for focal abnormalities that increase risk for recurrent seizure, if normal, a 48-hour EEG will be done. Memory loss likely due to postconcussion syndrome, we discussed the importance of physical and cognitive rest. Check B12 level. [place] driving laws were discussed with the patient, and she knows to stop driving after a seizure, until 6 months seizure-free. Follow-up after tests, she knows to call for any changes.   Thank you for allowing me to participate in the care of this patient. Please do not hesitate to call for any questions or concerns.   Patrcia DollyKaren Donnelle Olmeda, M.D.  CC: Dr. Concepcion ElkAvbuere

## 2019-05-17 NOTE — Patient Instructions (Signed)
1. Schedule 1-hour sleep-deprived EEG, then 48-hour EEG  2. Bloodwork for B12 level  3. Follow-up after tests, call for any changes  Seizure Precautions: 1. If medication has been prescribed for you to prevent seizures, take it exactly as directed.  Do not stop taking the medicine without talking to your doctor first, even if you have not had a seizure in a long time.   2. Avoid activities in which a seizure would cause danger to yourself or to others.  Don't operate dangerous machinery, swim alone, or climb in high or dangerous places, such as on ladders, roofs, or girders.  Do not drive unless your doctor says you may.  3. If you have any warning that you may have a seizure, lay down in a safe place where you can't hurt yourself.    4.  No driving for 6 months from last seizure, as per [facility].   Please refer to the following link on the [facility] website for more information: http://www.epilepsyfoundation.org/answerplace/Social/driving/drivingu.cfm   5.  Maintain good sleep hygiene.  6.  Notify your neurology if you are planning pregnancy or if you become pregnant.  7.  Contact your doctor if you have any problems that may be related to the medicine you are taking.  8.  Call 911 and bring the patient back to the ED if:        A.  The seizure lasts longer than 5 minutes.       B.  The patient doesn't awaken shortly after the seizure  C.  The patient has new problems such as difficulty seeing, speaking or moving  D.  The patient was injured during the seizure  E.  The patient has a temperature over 102 F (39C)  F.  The patient vomited and now is having trouble breathing

## 2019-05-18 LAB — VITAMIN B12: Vitamin B-12: 551 pg/mL (ref 200–1100)

## 2019-05-21 ENCOUNTER — Other Ambulatory Visit: Payer: Medicaid Other

## 2019-05-21 ENCOUNTER — Telehealth: Payer: Self-pay

## 2019-05-21 NOTE — Telephone Encounter (Signed)
Pt informed of B12 results 

## 2019-05-21 NOTE — Telephone Encounter (Signed)
-----   Message from Cameron Sprang, MD sent at 05/20/2019  2:47 PM EDT ----- Pls let her know B12 level normal, thanks!

## 2019-05-24 ENCOUNTER — Ambulatory Visit (INDEPENDENT_AMBULATORY_CARE_PROVIDER_SITE_OTHER): Payer: Medicaid Other | Admitting: Neurology

## 2019-05-24 ENCOUNTER — Other Ambulatory Visit: Payer: Self-pay

## 2019-05-24 DIAGNOSIS — R569 Unspecified convulsions: Secondary | ICD-10-CM

## 2019-05-24 DIAGNOSIS — R413 Other amnesia: Secondary | ICD-10-CM

## 2019-05-30 ENCOUNTER — Emergency Department (HOSPITAL_COMMUNITY): Payer: Medicaid Other

## 2019-05-30 ENCOUNTER — Other Ambulatory Visit: Payer: Self-pay

## 2019-05-30 ENCOUNTER — Encounter (HOSPITAL_COMMUNITY): Payer: Self-pay

## 2019-05-30 ENCOUNTER — Ambulatory Visit
Admission: RE | Admit: 2019-05-30 | Discharge: 2019-05-30 | Disposition: A | Discharge status: Home / Self Care | Payer: Medicaid Other | Source: Ambulatory Visit | Attending: Surgery | Admitting: Surgery

## 2019-05-30 ENCOUNTER — Emergency Department (HOSPITAL_COMMUNITY)
Admission: EM | Admit: 2019-05-30 | Discharge: 2019-05-30 | Disposition: A | Discharge status: Home / Self Care | Payer: Medicaid Other | Attending: Emergency Medicine | Admitting: Emergency Medicine

## 2019-05-30 ENCOUNTER — Telehealth: Payer: Self-pay

## 2019-05-30 DIAGNOSIS — Y999 Unspecified external cause status: Secondary | ICD-10-CM | POA: Diagnosis not present

## 2019-05-30 DIAGNOSIS — Z79899 Other long term (current) drug therapy: Secondary | ICD-10-CM | POA: Diagnosis not present

## 2019-05-30 DIAGNOSIS — Y92003 Bedroom of unspecified non-institutional (private) residence as the place of occurrence of the external cause: Secondary | ICD-10-CM | POA: Insufficient documentation

## 2019-05-30 DIAGNOSIS — S43014A Anterior dislocation of right humerus, initial encounter: Secondary | ICD-10-CM | POA: Insufficient documentation

## 2019-05-30 DIAGNOSIS — X509XXA Other and unspecified overexertion or strenuous movements or postures, initial encounter: Secondary | ICD-10-CM | POA: Insufficient documentation

## 2019-05-30 DIAGNOSIS — R200 Anesthesia of skin: Secondary | ICD-10-CM

## 2019-05-30 DIAGNOSIS — S43004A Unspecified dislocation of right shoulder joint, initial encounter: Secondary | ICD-10-CM

## 2019-05-30 DIAGNOSIS — Y9389 Activity, other specified: Secondary | ICD-10-CM | POA: Diagnosis not present

## 2019-05-30 DIAGNOSIS — R202 Paresthesia of skin: Secondary | ICD-10-CM

## 2019-05-30 DIAGNOSIS — E041 Nontoxic single thyroid nodule: Secondary | ICD-10-CM

## 2019-05-30 DIAGNOSIS — F17200 Nicotine dependence, unspecified, uncomplicated: Secondary | ICD-10-CM | POA: Diagnosis not present

## 2019-05-30 DIAGNOSIS — S4991XA Unspecified injury of right shoulder and upper arm, initial encounter: Secondary | ICD-10-CM | POA: Diagnosis present

## 2019-05-30 DIAGNOSIS — R569 Unspecified convulsions: Secondary | ICD-10-CM

## 2019-05-30 MED ORDER — HYDROCODONE-ACETAMINOPHEN 5-325 MG PO TABS: 1.0000 | ORAL_TABLET | ORAL | 0 refills | Status: DC | PRN

## 2019-05-30 MED ORDER — HYDROMORPHONE HCL 1 MG/ML IJ SOLN
1.0000 mg | Freq: Once | INTRAMUSCULAR | Status: AC
  Administered 2019-05-30: 1 mg via INTRAVENOUS
  Filled 2019-05-30: qty 1

## 2019-05-30 MED ORDER — ONDANSETRON HCL 4 MG/2ML IJ SOLN
4.0000 mg | Freq: Once | INTRAMUSCULAR | Status: AC
  Administered 2019-05-30: 4 mg via INTRAVENOUS
  Filled 2019-05-30: qty 2

## 2019-05-30 MED ORDER — ETOMIDATE 2 MG/ML IV SOLN
10.0000 mg | Freq: Once | INTRAVENOUS | Status: AC
  Administered 2019-05-30: 10 mg via INTRAVENOUS
  Filled 2019-05-30: qty 10

## 2019-05-30 MED ORDER — MIDAZOLAM HCL 2 MG/2ML IJ SOLN
2.0000 mg | Freq: Once | INTRAMUSCULAR | Status: AC
  Administered 2019-05-30: 11:00:00 1 mg via INTRAVENOUS
  Filled 2019-05-30: qty 2

## 2019-05-30 NOTE — Discharge Instructions (Addendum)
Follow up with orthopedics. Take Norco as needed as prescribed for shoulder pain. Do not drive or operate machinery while taking Norco. Take Colace to prevent constipation with Norco.

## 2019-05-30 NOTE — ED Provider Notes (Signed)
[facility]-EMERGENCY DEPT Provider Note   CSN: [id number] Arrival date & time: 05/30/19  0932     History   Chief Complaint Chief Complaint  Patient presents with  . Shoulder Pain    HPI Barbara Gilbert is a 33 y.o. female.     33 year old female brought to the emergency room by EMS for right shoulder pain, possible dislocation.  Patient states that she rolled over in bed and pressed on her arm wrong which she thinks caused her shoulder to dislocate.  Incident occurred at 830 this morning, n.p.o. since last night.  Patient reports 1 prior dislocation in August, states that she fell off her porch, and had a first-time seizure, states that she was unconscious when she had her shoulder reduced.  No known prior shoulder injuries otherwise, no other seizure history, denies concerns for seizure with this episode.  No other complaints or concerns today.  Denies possibility of pregnancy.     Past Medical History:  Diagnosis Date  . Anxiety attack   . Panic disorder   . PTSD (post-traumatic stress disorder)    Held hostage while pregnant as a teen    Patient Active Problem List   Diagnosis Date Noted  . 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 abortions     X 2----2012 and 2014  . LAPAROSCOPIC TUBAL LIGATION Bilateral 10/04/2017   Procedure: LAPAROSCOPIC TUBAL LIGATION WITH FILSHIE CLIPS;  Surgeon: Adam PhenixArnold, James G, MD;  Location: [facility];  Service: Gynecology;  Laterality: Bilateral;     OB History    Gravida  3   Para  1   Term  1   Preterm  0   AB  2   Living  1     SAB  0   TAB  2   Ectopic  0   Multiple      Live Births  1            Home Medications    Prior to Admission medications   Medication Sig Start Date End Date Taking? Authorizing Provider  cyclobenzaprine (FLEXERIL) 5 MG tablet Take 5 mg by mouth as needed. 04/18/19   [provider]   HYDROcodone-acetaminophen (NORCO/VICODIN) 5-325 MG tablet Take 1 tablet by mouth every 4 (four) hours as needed. 05/30/19   Jeannie FendMurphy, Laura A, PA-C    Family History Family History  Problem Relation Age of Onset  . Fibromyalgia Mother   . Mental illness Mother   . Gout Father   . Asthma Sister     Social History Social History   Tobacco Use  . Smoking status: Current Every Day Smoker    Packs/day: 0.30    Start date: 2009  . Smokeless tobacco: Never Used  Substance Use Topics  . Alcohol use: No  . Drug use: Yes    Types: Marijuana    Comment: 1 week ago     Allergies   Patient has no known allergies.   Review of Systems Review of Systems  Constitutional: Negative for fever.  Musculoskeletal: Positive for arthralgias and myalgias. Negative for gait problem, neck pain and neck stiffness.  Skin: Negative for rash and wound.  Allergic/Immunologic: Negative for immunocompromised state.  Neurological: Negative for weakness and numbness.  Hematological: Does not bruise/bleed easily.  Psychiatric/Behavioral: Negative for confusion.  All other systems reviewed and are negative.    Physical Exam Updated Vital Signs BP (!) 168/92  Pulse 71   Temp 97.9 F (36.6 C) (Oral)   Resp 16   SpO2 100%   Physical Exam Vitals signs and nursing note reviewed.  Constitutional:      General: She is not in acute distress.    Appearance: She is well-developed. She is not diaphoretic.  HENT:     Head: Normocephalic and atraumatic.  Cardiovascular:     Pulses: Normal pulses.  Pulmonary:     Effort: Pulmonary effort is normal.  Musculoskeletal:        General: Tenderness and deformity present.     Right shoulder: She exhibits decreased range of motion, tenderness, deformity, pain and spasm. She exhibits no swelling, no effusion, no crepitus, no laceration and normal pulse.       Arms:  Skin:    General: Skin is warm and dry.     Findings: No erythema or rash.  Neurological:      Mental Status: She is alert and oriented to person, place, and time.     Sensory: No sensory deficit.  Psychiatric:        Behavior: Behavior normal.      ED Treatments / Results  Labs (all labs ordered are listed, but only abnormal results are displayed) Labs Reviewed - No data to display  EKG None  Radiology Dg Shoulder Right  Result Date: 05/30/2019 CLINICAL DATA:  Pain with history of dislocation EXAM: RIGHT SHOULDER - 2+ VIEW COMPARISON:  April 09, 2019 FINDINGS: Frontal and Y scapular images were obtained. There is a subcoracoid anterior dislocation. No fracture evident. Joint spaces appear unremarkable. No erosive change. Visualized right lung clear. IMPRESSION: Subcoracoid anterior dislocation. No fracture or appreciable arthropathic change. Electronically Signed   By: Bretta Bang III M.D.   On: 05/30/2019 10:12   Dg Shoulder Right Portable  Result Date: 05/30/2019 CLINICAL DATA:  Status post reduction of right shoulder dislocation. EXAM: PORTABLE RIGHT SHOULDER COMPARISON:  Plain films right shoulder earlier today. FINDINGS: The shoulder has been reduced. Hill-Sachs lesion noted. No new abnormality. IMPRESSION: Successful reduction of dislocation.  Hill-Sachs lesion noted. Electronically Signed   By: Drusilla Kanner M.D.   On: 05/30/2019 11:16    Procedures Procedures (including critical care time)  Medications Ordered in ED Medications  HYDROmorphone (DILAUDID) injection 1 mg (1 mg Intravenous Given 05/30/19 1010)  ondansetron (ZOFRAN) injection 4 mg (4 mg Intravenous Given 05/30/19 1010)  etomidate (AMIDATE) injection 10 mg (10 mg Intravenous Given 05/30/19 1053)  midazolam (VERSED) injection 2 mg (1 mg Intravenous Given 05/30/19 1058)     Initial Impression / Assessment and Plan / ED Course  I have reviewed the triage vital signs and the nursing notes.  Pertinent labs & imaging results that were available during my care of the patient were reviewed  by me and considered in my medical decision making (see chart for details).  Clinical Course as of May 30 1143  Thu May 30, 2019  7134 33 year old female brought in by EMS for right shoulder dislocation. Neurovascularly intact, no traumatic injuries, denies seizure. XR shows supcoracoid anterior dislocation.  Patient was given Dilaudid and Zofran for her pain.  Patient was sedated and dislocation was reduced by Dr. Particia Nearing, ED attending.  Postreduction x-ray shows successful reduction with Hill-Sachs lesion.  Patient was placed in a shoulder immobilizer.  Patient is awake, alert, oriented and ready for discharge.  Patient sees Dr. Loreta Ave with orthopedics, plans to call today to update. Given rx for Norco with precautions discussed.    [  LM]    Clinical Course User Index [LM] Tacy Learn, PA-C      Final Clinical Impressions(s) / ED Diagnoses   Final diagnoses:  Dislocation of right shoulder joint, initial encounter    ED Discharge Orders         Ordered    HYDROcodone-acetaminophen (NORCO/VICODIN) 5-325 MG tablet  Every 4 hours PRN     05/30/19 1116           Tacy Learn, PA-C 05/30/19 1144    Isla Pence, MD 05/30/19 1306

## 2019-05-30 NOTE — Procedures (Signed)
ELECTROENCEPHALOGRAM REPORT  Date of Study: 05/24/2019  Patient's Name: Barbara Gilbert MRN: [id number] Date of Birth: 1985-10-24  Referring Provider: Dr. Ellouise Newer  Clinical History: This is a 33 year old woman with an unwitnessed fall followed by an episode of staring/unresponsiveness with convulsive activity.   Medications: Flexeril  Technical Summary: A multichannel digital 1-hour EEG recording measured by the international 10-20 system with electrodes applied with paste and impedances below 5000 ohms performed in our laboratory with EKG monitoring in an awake and asleep patient.  Hyperventilation was not performed. Photic stimulation was performed.  The digital EEG was referentially recorded, reformatted, and digitally filtered in a variety of bipolar and referential montages for optimal display.    Description: The patient is awake and asleep during the recording.  During maximal wakefulness, there is a symmetric, medium voltage 9.5 Hz posterior dominant rhythm that attenuates with eye opening.  The record is symmetric.  During drowsiness and sleep, there is an increase in theta slowing of the background.  Vertex waves and symmetric sleep spindles were seen.  Photic stimulation did not elicit any abnormalities.  There were no epileptiform discharges or electrographic seizures seen.    EKG lead was unremarkable.  Impression: This 1-hour awake and asleep EEG is normal.    Clinical Correlation: A normal EEG does not exclude a clinical diagnosis of epilepsy.  If further clinical questions remain, prolonged EEG may be helpful.  Clinical correlation is advised.   Ellouise Newer, M.D.

## 2019-05-30 NOTE — Telephone Encounter (Signed)
-----   Message from Cameron Sprang, MD sent at 05/30/2019  1:46 PM EDT ----- Pls let her know the EEG was normal, however it is not like a pregnancy test that is positive or negative, it is just a snapshot of her brain waves for the 1 hour we did the test. Proceed with 48-hour EEG as discussed. Thanks!

## 2019-05-30 NOTE — ED Provider Notes (Signed)
Physical Exam  BP (!) 142/82   Pulse 77   Temp 97.9 F (36.6 C) (Oral)   Resp 16   SpO2 100%   Physical Exam  ED Course/Procedures   Clinical Course as of May 29 1308  Thu May 29, 6990  4120 33 year old female brought in by EMS for right shoulder dislocation. Neurovascularly intact, no traumatic injuries, denies seizure. XR shows supcoracoid anterior dislocation.  Patient was given Dilaudid and Zofran for her pain.  Patient was sedated and dislocation was reduced by Dr. Gilford Raid, ED attending.  Postreduction x-ray shows successful reduction with Hill-Sachs lesion.  Patient was placed in a shoulder immobilizer.  Patient is awake, alert, oriented and ready for discharge.  Patient sees Dr. Earleen Newport with orthopedics, plans to call today to update. Given rx for Norco with precautions discussed.    [LM]    Clinical Course User Index [LM] Tacy Learn, PA-C    .Sedation  Date/Time: 05/30/2019 1:06 PM Performed by: Isla Pence, MD Authorized by: Isla Pence, MD   Consent:    Consent obtained:  Written   Consent given by:  Patient   Alternatives discussed:  Analgesia without sedation Universal protocol:    Immediately prior to procedure a time out was called: yes     Patient identity confirmation method:  Verbally with patient Pre-sedation assessment:    Time since last food or drink:  12   ASA classification: class 1 - normal, healthy patient     Neck mobility: normal     Mallampati score:  I - soft palate, uvula, fauces, pillars visible   Pre-sedation assessments completed and reviewed: airway patency, cardiovascular function, hydration status, mental status, nausea/vomiting, pain level, respiratory function and temperature   Immediate pre-procedure details:    Reassessment: Patient reassessed immediately prior to procedure     Reviewed: vital signs     Verified: bag valve mask available, emergency equipment available, intubation equipment available, IV patency  confirmed, oxygen available and reversal medications available   Procedure details (see MAR for exact dosages):    Preoxygenation:  Room air   Sedation:  Midazolam and etomidate   Intended level of sedation: deep   Analgesia:  Hydromorphone   Intra-procedure monitoring:  Blood pressure monitoring, cardiac monitor, continuous capnometry, continuous pulse oximetry, frequent LOC assessments and frequent vital sign checks   Intra-procedure events: none     Total Provider sedation time (minutes):  30 Post-procedure details:    Post-sedation assessment completed:  05/30/2019 11:08 AM   Attendance: Constant attendance by certified staff until patient recovered     Recovery: Patient returned to pre-procedure baseline     Post-sedation assessments completed and reviewed: airway patency, cardiovascular function, hydration status, mental status, nausea/vomiting, pain level, respiratory function and temperature     Patient tolerance:  Tolerated well, no immediate complications Reduction of dislocation  Date/Time: 05/30/2019 1:08 PM Performed by: Isla Pence, MD Authorized by: Isla Pence, MD  Consent: Written consent obtained. Risks and benefits: risks, benefits and alternatives were discussed Consent given by: patient Patient understanding: patient states understanding of the procedure being performed Required items: required blood products, implants, devices, and special equipment available Patient identity confirmed: verbally with patient Time out: Immediately prior to procedure a "time out" was called to verify the correct patient, procedure, equipment, support staff and site/side marked as required. Preparation: Patient was prepped and draped in the usual sterile fashion. Local anesthesia used: no  Anesthesia: Local anesthesia used: no  Sedation: Patient sedated: yes Sedatives:  etomidate and midazolam Vitals: Vital signs were monitored during sedation.  Patient tolerance:  patient tolerated the procedure well with no immediate complications Comments: See nurse's notes for times.     MDM        Jacalyn Lefevre, MD 05/30/19 1309

## 2019-05-30 NOTE — Telephone Encounter (Signed)
Advised of results

## 2019-05-30 NOTE — ED Triage Notes (Signed)
EMS reports from home, called out for right shoulder pain, Pt states she woke up and felt like her shoulder popped. Hx of fall in August with dislocation. Pt supposed to have surgery.  BP 122/78 HR 117 RR 18 Sp02 98

## 2019-05-31 LAB — CYTOLOGY - NON PAP

## 2019-05-31 NOTE — Patient Instructions (Addendum)
DUE TO COVID-19 ONLY ONE VISITOR IS ALLOWED TO COME WITH YOU AND STAY IN THE WAITING ROOM ONLY DURING PRE OP AND PROCEDURE DAY OF SURGERY. THE 1 VISITOR MAY VISIT WITH YOU AFTER SURGERY IN YOUR PRIVATE ROOM DURING VISITING HOURS ONLY!   YOUR COVID TEST IS COMPLETED, PLEASE BEGIN THE QUARANTINE INSTRUCTIONS AS OUTLINED IN YOUR HANDOUT.                Barbara Gilbert   Your procedure is scheduled on: 06-05-2019   Report to [facility] Main  Entrance   Report to admitting at 10:30AM     Call this number if you have problems the morning of surgery [phone]    Do not eat food After Midnight. YOU MAY HAVE CLEAR LIQUIDS FROM MIDNIGHT UNTIL 9:30AM. At 9:30AM Please finish the prescribed Pre-Surgery ENSURE drink. Nothing by mouth after you finish the ENSURE drink !    CLEAR LIQUID DIET   Foods Allowed                                                                     Foods Excluded  Coffee and tea, regular and decaf                             liquids that you cannot  Plain Jell-O any favor except red or purple                                           see through such as: Fruit ices (not with fruit pulp)                                     milk, soups, orange juice  Iced Popsicles                                    All solid food Carbonated beverages, regular and diet                                    Cranberry, grape and apple juices Sports drinks like Gatorade Lightly seasoned clear broth or consume(fat free) Sugar, honey syrup  Sample Menu Breakfast                                Lunch                                     Supper Cranberry juice                    Beef broth                            Chicken  broth Jell-O                                     Grape juice                           Apple juice Coffee or tea                        Jell-O                                      Popsicle                                                Coffee or tea                         Coffee or tea  _____________________________________________________________________   BRUSH YOUR TEETH MORNING OF SURGERY AND RINSE YOUR MOUTH OUT, NO CHEWING GUM CANDY OR MINTS.     Take these medicines the morning of surgery with A SIP OF WATER: hydrocodone if needed                                 You may not have any metal on your body including hair pins and              piercings  Do not wear jewelry, make-up, lotions, powders or perfumes, deodorant             Do not wear nail polish on your fingernails.  Do not shave  48 hours prior to surgery.             Do not bring valuables to the hospital. [facility] IS NOT             RESPONSIBLE   FOR VALUABLES.  Contacts, dentures or bridgework may not be worn into surgery.       Patients discharged the day of surgery will not be allowed to drive home. IF YOU ARE HAVING SURGERY AND GOING HOME THE SAME DAY, YOU MUST HAVE AN ADULT TO DRIVE YOU HOME AND BE WITH YOU FOR 24 HOURS. YOU MAY GO HOME BY TAXI OR UBER OR ORTHERWISE, BUT AN ADULT MUST ACCOMPANY YOU HOME AND STAY WITH YOU FOR 24 HOURS.  Name and phone number of your driver:  Special Instructions: N/A              Please read over the following fact sheets you were given: _____________________________________________________________________             [facility] - Preparing for Surgery Before surgery, you can play an important role.  Because skin is not sterile, your skin needs to be as free of germs as possible.  You can reduce the number of germs on your skin by washing with CHG (chlorahexidine gluconate) soap before surgery.  CHG is an antiseptic cleaner which kills germs and bonds with the skin to continue killing germs even after washing. Please DO NOT use if you have an allergy  to CHG or antibacterial soaps.  If your skin becomes reddened/irritated stop using the CHG and inform your nurse when you arrive at Short Stay. Do not shave (including legs and underarms) for  at least 48 hours prior to the first CHG shower.  You may shave your face/neck. Please follow these instructions carefully:  1.  Shower with CHG Soap the night before surgery and the  morning of Surgery.  2.  If you choose to wash your hair, wash your hair first as usual with your  normal  shampoo.  3.  After you shampoo, rinse your hair and body thoroughly to remove the  shampoo.                           4.  Use CHG as you would any other liquid soap.  You can apply chg directly  to the skin and wash                       Gently with a scrungie or clean washcloth.  5.  Apply the CHG Soap to your body ONLY FROM THE NECK DOWN.   Do not use on face/ open                           Wound or open sores. Avoid contact with eyes, ears mouth and genitals (private parts).                       Wash face,  Genitals (private parts) with your normal soap.             6.  Wash thoroughly, paying special attention to the area where your surgery  will be performed.  7.  Thoroughly rinse your body with warm water from the neck down.  8.  DO NOT shower/wash with your normal soap after using and rinsing off  the CHG Soap.                9.  Pat yourself dry with a clean towel.            10.  Wear clean pajamas.            11.  Place clean sheets on your bed the night of your first shower and do not  sleep with pets. Day of Surgery : Do not apply any lotions/deodorants the morning of surgery.  Please wear clean clothes to the hospital/surgery center.  FAILURE TO FOLLOW THESE INSTRUCTIONS MAY RESULT IN THE CANCELLATION OF YOUR SURGERY PATIENT SIGNATURE_________________________________  NURSE SIGNATURE__________________________________  ________________________________________________________________________   Barbara MireIncentive Spirometer  An incentive spirometer is a tool that can help keep your lungs clear and active. This tool measures how well you are filling your lungs with each breath. Taking long deep  breaths may help reverse or decrease the chance of developing breathing (pulmonary) problems (especially infection) following:  A long period of time when you are unable to move or be active. BEFORE THE PROCEDURE   If the spirometer includes an indicator to show your best effort, your nurse or respiratory therapist will set it to a desired goal.  If possible, sit up straight or lean slightly forward. Try not to slouch.  Hold the incentive spirometer in an upright position. INSTRUCTIONS FOR USE  1. Sit on the edge of your bed if possible, or sit up as far as  you can in bed or on a chair. 2. Hold the incentive spirometer in an upright position. 3. Breathe out normally. 4. Place the mouthpiece in your mouth and seal your lips tightly around it. 5. Breathe in slowly and as deeply as possible, raising the piston or the ball toward the top of the column. 6. Hold your breath for 3-5 seconds or for as long as possible. Allow the piston or ball to fall to the bottom of the column. 7. Remove the mouthpiece from your mouth and breathe out normally. 8. Rest for a few seconds and repeat Steps 1 through 7 at least 10 times every 1-2 hours when you are awake. Take your time and take a few normal breaths between deep breaths. 9. The spirometer may include an indicator to show your best effort. Use the indicator as a goal to work toward during each repetition. 10. After each set of 10 deep breaths, practice coughing to be sure your lungs are clear. If you have an incision (the cut made at the time of surgery), support your incision when coughing by placing a pillow or rolled up towels firmly against it. Once you are able to get out of bed, walk around indoors and cough well. You may stop using the incentive spirometer when instructed by your caregiver.  RISKS AND COMPLICATIONS  Take your time so you do not get dizzy or light-headed.  If you are in pain, you may need to take or ask for pain medication before  doing incentive spirometry. It is harder to take a deep breath if you are having pain. AFTER USE  Rest and breathe slowly and easily.  It can be helpful to keep track of a log of your progress. Your caregiver can provide you with a simple table to help with this. If you are using the spirometer at home, follow these instructions: [place] IF:   You are having difficultly using the spirometer.  You have trouble using the spirometer as often as instructed.  Your pain medication is not giving enough relief while using the spirometer.  You develop fever of 100.5 F (38.1 C) or higher. SEEK IMMEDIATE MEDICAL CARE IF:   You cough up bloody sputum that had not been present before.  You develop fever of 102 F (38.9 C) or greater.  You develop worsening pain at or near the incision site. MAKE SURE YOU:   Understand these instructions.  Will watch your condition.  Will get help right away if you are not doing well or get worse. Document Released: 12/05/2006 Document Revised: 10/17/2011 Document Reviewed: 02/05/2007 [facility] Patient Information 2014 [place], [place].   ________________________________________________________________________

## 2019-05-31 NOTE — H&P (Signed)
PREOPERATIVE H&P  Chief Complaint: RIGHT SHOULDER DISLOCATION  HPI: Barbara Gilbert is a 33 y.o. female who presents for preoperative history and physical prior to scheduled surgery, SHOULDER ARTHROSCOPY WITH BANKART REPAIR.   Patient has a past medical history significant for seizure disorder. She had one seizure following a head injury.   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  She had a shoulder dislocation at the time, but no previous history of shoulder instability.  She has had multiple instances were her shoulder has popped out of place after a trial of conservative measures. Most recently, she was stretching and her shoulder dislocated. She was unable to reduce it on her own, so she went to the Emergency Department. She had to be sedated for the reduction. 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  She is right hand dominant baseline and continues to have apprehension with range of motion of the shoulder. Her symptoms are rated as moderate to severe, and have been worsening.  This is significantly impairing activities of daily living.    Please see clinic note for further details on this patient's care.    She has elected for surgical management.   Past Medical History:  Diagnosis Date  . Anxiety attack   . Panic disorder   . 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 abortions     X 2----2012 and 2014  . LAPAROSCOPIC TUBAL LIGATION Bilateral 10/04/2017   Procedure: LAPAROSCOPIC TUBAL LIGATION WITH FILSHIE CLIPS;  Surgeon: Woodroe Mode, MD;  Location: [place];  Service: Gynecology;  Laterality: Bilateral;   Social History   Socioeconomic History  . Marital status: Single    Spouse name: Not on file  . Number of children: Not on file  . Years of education: Not on file  . Highest  education level: Not on file  Occupational History  . Not on file  Social Needs  . Financial resource strain: Not on file  . Food insecurity    Worry: Not on file    Inability: Not on file  . Transportation needs    Medical: Not on file    Non-medical: Not on file  Tobacco Use  . Smoking status: Current Every Day Smoker    Packs/day: 0.30    Start date: 2009  . Smokeless tobacco: Never Used  Substance and Sexual Activity  . Alcohol use: No  . Drug use: Yes    Types: Marijuana    Comment: 1 week ago  . Sexual activity: Yes    Partners: Male    Birth control/protection: None  Lifestyle  . Physical activity    Days per week: Not on file    Minutes per session: Not on file  . Stress: Not on file  Relationships  . Social Herbalist on phone: Not on file    Gets together: Not on file    Attends religious service: Not on file    Active member of club or organization: Not on file    Attends meetings of clubs or organizations: Not on file    Relationship status: Not on file  Other Topics Concern  . Not on file  Social History Narrative   Right handed      Completed HS  Lives with son       Family History  Problem Relation Age of Onset  . Fibromyalgia Mother   . Mental illness Mother   . Gout Father   . Asthma Sister    No Known Allergies Prior to Admission medications   Medication Sig Start Date End Date Taking? Authorizing Provider  Ascorbic Acid (VITAMIN C PO) Take 1 tablet by mouth daily. Gummie   Yes [provider]  HYDROcodone-acetaminophen (NORCO/VICODIN) 5-325 MG tablet Take 1 tablet by mouth every 4 (four) hours as needed. Patient taking differently: Take 1 tablet by mouth every 4 (four) hours as needed for moderate pain.  05/30/19  Yes Barbara Fend, PA-C  Multiple Vitamins-Minerals (MULTIVITAMIN WITH MINERALS) tablet Take 1 tablet by mouth daily.   Yes [provider]     Positive ROS: All other systems have been  reviewed and were otherwise negative with the exception of those mentioned in the HPI and as above.  Physical Exam: General: Alert, no acute distress Cardiovascular: No pedal edema Respiratory: No cyanosis, no use of accessory musculature GI: No organomegaly, abdomen is soft and non-tender Skin: No lesions in the area of chief complaint Neurologic: Sensation intact distally Psychiatric: Patient is competent for consent with normal mood and affect Lymphatic: No axillary or cervical lymphadenopathy  MUSCULOSKELETAL:  Right shoulder: active forward elevation of 120, assisted or passive to 140 with limitation secondary to shoulder apprehension.  She has pain and apprehension with abduction and external rotation position that is improved with posterior relocation test.  Imaging: MRI demonstrates anterior inferior labral tear with possible engaging Hill-Sachs lesion.    Most recent X-rays from Emergency Department on 10/22 demonstrate a Hill-Sachs lesion.  Assessment: RIGHT SHOULDER DISLOCATION  Plan: Plan for Procedure(s): SHOULDER ARTHROSCOPY WITH BANKART REPAIR  Patient has had multiple episodes of instability and dislocation. This is significantly impairing her activities of daily living. The pain is worsening and she has begun to fear certain movements. Patient wishes to proceed with surgery.  Patient has a history of a seizure following a head injury. She had no prior instances of seizures and has not had one since. She is not on any anti-epileptics. Neurologist was planning an EEG for the patient, however she was not able to get an appointment in the near future. She wishes to proceed with surgery as soon as possible.   The risks benefits and alternatives were discussed with the patient including but not limited to the risks of nonoperative treatment, versus surgical intervention including infection, bleeding, nerve injury,  blood clots, cardiopulmonary complications, morbidity,  mortality, among others, and they were willing to proceed.   We talked about the risks, benefits and alternatives of arthroscopic capsulorrhaphy and Bankart repair with possible Remplissage procedure.  She understands the nature of the recovery, as well as the surgery and the risk of continued instability.    The patient acknowledged the explanation, agreed to proceed with the plan and consent was signed.   Operative Plan: Arthroscopic capsulorrhaphy and Bankart repair with possible Remplissage procedure Discharge Medications: Standard (Tylenol, Mobic, Oxycodone, Zofran) DVT Prophylaxis: None required Physical Therapy: Outpatient PT Special Discharge needs: Sling   Barbara Honey, PA  05/31/2019 2:23 PM

## 2019-06-01 ENCOUNTER — Other Ambulatory Visit (HOSPITAL_COMMUNITY)
Admission: RE | Admit: 2019-06-01 | Discharge: 2019-06-01 | Disposition: A | Discharge status: Home / Self Care | Payer: Medicaid Other | Source: Ambulatory Visit | Attending: Orthopaedic Surgery | Admitting: Orthopaedic Surgery

## 2019-06-01 DIAGNOSIS — Z20828 Contact with and (suspected) exposure to other viral communicable diseases: Secondary | ICD-10-CM | POA: Insufficient documentation

## 2019-06-01 DIAGNOSIS — Z01812 Encounter for preprocedural laboratory examination: Secondary | ICD-10-CM | POA: Insufficient documentation

## 2019-06-02 LAB — NOVEL CORONAVIRUS, NAA (HOSP ORDER, SEND-OUT TO REF LAB; TAT 18-24 HRS): SARS-CoV-2, NAA: NOT DETECTED

## 2019-06-03 ENCOUNTER — Other Ambulatory Visit: Payer: Self-pay

## 2019-06-03 ENCOUNTER — Encounter (HOSPITAL_COMMUNITY)
Admission: RE | Admit: 2019-06-03 | Discharge: 2019-06-03 | Disposition: A | Discharge status: Home / Self Care | Payer: Medicaid Other | Source: Ambulatory Visit | Attending: Orthopaedic Surgery | Admitting: Orthopaedic Surgery

## 2019-06-03 ENCOUNTER — Telehealth: Payer: Self-pay | Admitting: *Deleted

## 2019-06-03 ENCOUNTER — Encounter (HOSPITAL_COMMUNITY): Payer: Self-pay

## 2019-06-03 DIAGNOSIS — S43004A Unspecified dislocation of right shoulder joint, initial encounter: Secondary | ICD-10-CM | POA: Diagnosis not present

## 2019-06-03 DIAGNOSIS — Z01812 Encounter for preprocedural laboratory examination: Secondary | ICD-10-CM | POA: Diagnosis not present

## 2019-06-03 HISTORY — DX: Epilepsy, unspecified, not intractable, without status epilepticus: G40.909

## 2019-06-03 LAB — CBC
HCT: 42.8 % (ref 36.0–46.0)
Hemoglobin: 13.5 g/dL (ref 12.0–15.0)
MCH: 30.3 pg (ref 26.0–34.0)
MCHC: 31.5 g/dL (ref 30.0–36.0)
MCV: 96.2 fL (ref 80.0–100.0)
Platelets: 296 10*3/uL (ref 150–400)
RBC: 4.45 MIL/uL (ref 3.87–5.11)
RDW: 12.7 % (ref 11.5–15.5)
WBC: 10.7 10*3/uL — ABNORMAL HIGH (ref 4.0–10.5)
nRBC: 0 % (ref 0.0–0.2)

## 2019-06-03 NOTE — Telephone Encounter (Signed)
LMOM to call back to set up 48 hour ambulatory EEG

## 2019-06-03 NOTE — Progress Notes (Signed)
PCP - Dr. Jeanie Cooks  Consulting neurologist- Dr. Ellouise Newer  Cardiologist - n/a   Chest x-ray -  EKG -  Stress Test - n/a ECHO - n/a Cardiac Cath - n/a  Sleep Study - n/a CPAP - n/a  Fasting Blood Sugar - n/a Checks Blood Sugar _____ times a day  Blood Thinner Instructions: n/a Aspirin Instructions: n/a Last Dose: n/a   Anesthesia review: August 2020 pt fell down stairs, had seizure x 1; evaluated and hospitalized overnight with no further seizures; May 24, 2019 had EEG done and has been evaluated by Dr. Ellouise Newer; pt is on no seizure meds and no further neuro symptoms  Patient denies shortness of breath, fever, cough and chest pain at PAT appointment   Patient verbalized understanding of instructions that were given to them at the PAT appointment. Patient was also instructed that they will need to review over the PAT instructions again at home before surgery.

## 2019-06-05 ENCOUNTER — Telehealth (HOSPITAL_COMMUNITY): Payer: Self-pay | Admitting: *Deleted

## 2019-06-05 ENCOUNTER — Ambulatory Visit (HOSPITAL_COMMUNITY)
Admission: RE | Admit: 2019-06-05 | Discharge: 2019-06-05 | Disposition: A | Discharge status: Home / Self Care | Payer: Medicaid Other | Attending: Orthopaedic Surgery | Admitting: Orthopaedic Surgery

## 2019-06-05 ENCOUNTER — Ambulatory Visit (HOSPITAL_COMMUNITY): Payer: Medicaid Other | Admitting: Anesthesiology

## 2019-06-05 ENCOUNTER — Other Ambulatory Visit: Payer: Self-pay

## 2019-06-05 ENCOUNTER — Encounter (HOSPITAL_COMMUNITY)
Admission: RE | Disposition: A | Discharge status: Home / Self Care | Payer: Self-pay | Source: Home / Self Care | Attending: Orthopaedic Surgery

## 2019-06-05 ENCOUNTER — Encounter (HOSPITAL_COMMUNITY): Payer: Self-pay | Admitting: Anesthesiology

## 2019-06-05 DIAGNOSIS — M85811 Other specified disorders of bone density and structure, right shoulder: Secondary | ICD-10-CM | POA: Diagnosis not present

## 2019-06-05 DIAGNOSIS — S43401A Unspecified sprain of right shoulder joint, initial encounter: Secondary | ICD-10-CM | POA: Diagnosis not present

## 2019-06-05 DIAGNOSIS — G40909 Epilepsy, unspecified, not intractable, without status epilepticus: Secondary | ICD-10-CM | POA: Diagnosis not present

## 2019-06-05 DIAGNOSIS — M25311 Other instability, right shoulder: Secondary | ICD-10-CM | POA: Diagnosis not present

## 2019-06-05 DIAGNOSIS — W1789XA Other fall from one level to another, initial encounter: Secondary | ICD-10-CM | POA: Insufficient documentation

## 2019-06-05 HISTORY — PX: SHOULDER ARTHROSCOPY WITH BANKART REPAIR: SHX5673

## 2019-06-05 LAB — PREGNANCY, URINE: Preg Test, Ur: NEGATIVE

## 2019-06-05 SURGERY — SHOULDER ARTHROSCOPY WITH BANKART REPAIR
Anesthesia: Regional | Site: Shoulder | Laterality: Right

## 2019-06-05 MED ORDER — ONDANSETRON HCL 4 MG PO TABS: 4.0000 mg | ORAL_TABLET | Freq: Three times a day (TID) | ORAL | 1 refills | Status: AC | PRN

## 2019-06-05 MED ORDER — SUGAMMADEX SODIUM 200 MG/2ML IV SOLN
INTRAVENOUS | Status: DC | PRN
  Administered 2019-06-05: 150 mg via INTRAVENOUS

## 2019-06-05 MED ORDER — ROCURONIUM BROMIDE 10 MG/ML (PF) SYRINGE
PREFILLED_SYRINGE | INTRAVENOUS | Status: AC
  Filled 2019-06-05: qty 10

## 2019-06-05 MED ORDER — LIDOCAINE 2% (20 MG/ML) 5 ML SYRINGE
INTRAMUSCULAR | Status: AC
  Filled 2019-06-05: qty 5

## 2019-06-05 MED ORDER — HYDROMORPHONE HCL 1 MG/ML IJ SOLN
INTRAMUSCULAR | Status: AC
  Administered 2019-06-05: 0.5 mg via INTRAVENOUS
  Filled 2019-06-05: qty 1

## 2019-06-05 MED ORDER — ACETAMINOPHEN 500 MG PO TABS
1000.0000 mg | ORAL_TABLET | Freq: Once | ORAL | Status: AC
  Administered 2019-06-05: 1000 mg via ORAL
  Filled 2019-06-05: qty 2

## 2019-06-05 MED ORDER — SUCCINYLCHOLINE CHLORIDE 200 MG/10ML IV SOSY
PREFILLED_SYRINGE | INTRAVENOUS | Status: AC
  Filled 2019-06-05: qty 10

## 2019-06-05 MED ORDER — FENTANYL CITRATE (PF) 100 MCG/2ML IJ SOLN
50.0000 ug | INTRAMUSCULAR | Status: DC
  Administered 2019-06-05: 11:00:00 100 ug via INTRAVENOUS

## 2019-06-05 MED ORDER — BUPIVACAINE HCL (PF) 0.5 % IJ SOLN
INTRAMUSCULAR | Status: DC | PRN
  Administered 2019-06-05: 15 mL via PERINEURAL

## 2019-06-05 MED ORDER — OXYCODONE HCL 5 MG PO TABS: ORAL_TABLET | ORAL | 0 refills | Status: AC

## 2019-06-05 MED ORDER — SODIUM CHLORIDE 0.9 % IR SOLN
Status: DC | PRN
  Administered 2019-06-05: 18000 mL

## 2019-06-05 MED ORDER — MIDAZOLAM HCL 2 MG/2ML IJ SOLN
INTRAMUSCULAR | Status: AC
  Administered 2019-06-05: 2 mg via INTRAVENOUS
  Filled 2019-06-05: qty 2

## 2019-06-05 MED ORDER — EPINEPHRINE PF 1 MG/ML IJ SOLN
INTRAMUSCULAR | Status: AC
  Filled 2019-06-05: qty 1

## 2019-06-05 MED ORDER — KETOROLAC TROMETHAMINE 30 MG/ML IJ SOLN
INTRAMUSCULAR | Status: AC
  Administered 2019-06-05: 30 mg via INTRAVENOUS
  Filled 2019-06-05: qty 1

## 2019-06-05 MED ORDER — ROCURONIUM BROMIDE 10 MG/ML (PF) SYRINGE
PREFILLED_SYRINGE | INTRAVENOUS | Status: DC | PRN
  Administered 2019-06-05: 40 mg via INTRAVENOUS

## 2019-06-05 MED ORDER — LIDOCAINE 2% (20 MG/ML) 5 ML SYRINGE
INTRAMUSCULAR | Status: DC | PRN
  Administered 2019-06-05: 60 mg via INTRAVENOUS

## 2019-06-05 MED ORDER — EPINEPHRINE PF 1 MG/ML IJ SOLN
INTRAMUSCULAR | Status: AC
  Filled 2019-06-05: qty 3

## 2019-06-05 MED ORDER — MIDAZOLAM HCL 2 MG/2ML IJ SOLN
1.0000 mg | INTRAMUSCULAR | Status: DC
  Administered 2019-06-05: 11:00:00 2 mg via INTRAVENOUS

## 2019-06-05 MED ORDER — OXYCODONE HCL 5 MG PO TABS
ORAL_TABLET | ORAL | Status: AC
  Filled 2019-06-05: qty 1

## 2019-06-05 MED ORDER — PROMETHAZINE HCL 25 MG/ML IJ SOLN: 6.2500 mg | INTRAMUSCULAR | Status: DC | PRN

## 2019-06-05 MED ORDER — CEFAZOLIN SODIUM-DEXTROSE 2-4 GM/100ML-% IV SOLN
INTRAVENOUS | Status: AC
  Filled 2019-06-05: qty 100

## 2019-06-05 MED ORDER — HYDROMORPHONE HCL 1 MG/ML IJ SOLN
0.2500 mg | INTRAMUSCULAR | Status: DC | PRN
  Administered 2019-06-05 (×3): 0.5 mg via INTRAVENOUS

## 2019-06-05 MED ORDER — ACETAMINOPHEN 500 MG PO TABS: 1000.0000 mg | ORAL_TABLET | Freq: Three times a day (TID) | ORAL | 0 refills | Status: AC

## 2019-06-05 MED ORDER — MELOXICAM 7.5 MG PO TABS: 7.5000 mg | ORAL_TABLET | Freq: Every day | ORAL | 2 refills | Status: DC

## 2019-06-05 MED ORDER — SODIUM CHLORIDE 0.9 % IR SOLN
Status: DC | PRN
  Administered 2019-06-05: 12:00:00 4 mL

## 2019-06-05 MED ORDER — DEXAMETHASONE SODIUM PHOSPHATE 10 MG/ML IJ SOLN
INTRAMUSCULAR | Status: DC | PRN
  Administered 2019-06-05: 10 mg via INTRAVENOUS

## 2019-06-05 MED ORDER — OXYCODONE HCL 5 MG PO TABS: 5.0000 mg | ORAL_TABLET | Freq: Once | ORAL | Status: DC | PRN

## 2019-06-05 MED ORDER — PHENYLEPHRINE 40 MCG/ML (10ML) SYRINGE FOR IV PUSH (FOR BLOOD PRESSURE SUPPORT)
PREFILLED_SYRINGE | INTRAVENOUS | Status: AC
  Filled 2019-06-05: qty 10

## 2019-06-05 MED ORDER — CEFAZOLIN SODIUM-DEXTROSE 2-4 GM/100ML-% IV SOLN: 2.0000 g | INTRAVENOUS | Status: DC

## 2019-06-05 MED ORDER — ONDANSETRON HCL 4 MG/2ML IJ SOLN
INTRAMUSCULAR | Status: DC | PRN
  Administered 2019-06-05: 4 mg via INTRAVENOUS

## 2019-06-05 MED ORDER — PROPOFOL 10 MG/ML IV BOLUS
INTRAVENOUS | Status: DC | PRN
  Administered 2019-06-05: 200 mg via INTRAVENOUS

## 2019-06-05 MED ORDER — KETOROLAC TROMETHAMINE 30 MG/ML IJ SOLN
30.0000 mg | Freq: Once | INTRAMUSCULAR | Status: AC | PRN
  Administered 2019-06-05: 14:00:00 30 mg via INTRAVENOUS

## 2019-06-05 MED ORDER — CHLORHEXIDINE GLUCONATE 4 % EX LIQD: 60.0000 mL | Freq: Once | CUTANEOUS | Status: DC

## 2019-06-05 MED ORDER — OXYCODONE HCL 5 MG/5ML PO SOLN: 5.0000 mg | Freq: Once | ORAL | Status: DC | PRN

## 2019-06-05 MED ORDER — FENTANYL CITRATE (PF) 100 MCG/2ML IJ SOLN
INTRAMUSCULAR | Status: DC | PRN
  Administered 2019-06-05 (×2): 50 ug via INTRAVENOUS

## 2019-06-05 MED ORDER — FENTANYL CITRATE (PF) 100 MCG/2ML IJ SOLN
INTRAMUSCULAR | Status: AC
  Administered 2019-06-05: 100 ug via INTRAVENOUS
  Filled 2019-06-05: qty 2

## 2019-06-05 MED ORDER — LACTATED RINGERS IV SOLN
INTRAVENOUS | Status: DC
  Administered 2019-06-05: 10:00:00 via INTRAVENOUS

## 2019-06-05 MED ORDER — BUPIVACAINE LIPOSOME 1.3 % IJ SUSP
INTRAMUSCULAR | Status: DC | PRN
  Administered 2019-06-05: 10 mL via PERINEURAL

## 2019-06-05 MED ORDER — FENTANYL CITRATE (PF) 100 MCG/2ML IJ SOLN
INTRAMUSCULAR | Status: AC
  Filled 2019-06-05: qty 2

## 2019-06-05 SURGICAL SUPPLY — 59 items
ANCHOR SUT 1.8 FBRTK KNTLS 2SU (Anchor) ×21 IMPLANT
APL PRP STRL LF DISP 70% ISPRP (MISCELLANEOUS) ×1
BLADE EXCALIBUR 4.0MM X 13CM (MISCELLANEOUS) ×1
BLADE EXCALIBUR 4.0X13 (MISCELLANEOUS) ×2 IMPLANT
BNDG COHESIVE 4X5 TAN STRL (GAUZE/BANDAGES/DRESSINGS) IMPLANT
CANNULA TWIST IN 8.25X7CM (CANNULA) IMPLANT
CHLORAPREP W/TINT 26 (MISCELLANEOUS) ×3 IMPLANT
CLOSURE WOUND 1/2 X4 (GAUZE/BANDAGES/DRESSINGS) ×1
COVER WAND RF STERILE (DRAPES) IMPLANT
DECANTER SPIKE VIAL GLASS SM (MISCELLANEOUS) IMPLANT
DISSECTOR 3.5MM X 13CM CVD (MISCELLANEOUS) IMPLANT
DISSECTOR 4.0MMX13CM CVD (MISCELLANEOUS) IMPLANT
DRAPE IMP U-DRAPE 54X76 (DRAPES) ×3 IMPLANT
DRAPE INCISE IOBAN 66X45 STRL (DRAPES) IMPLANT
DRAPE SHEET LG 3/4 BI-LAMINATE (DRAPES) IMPLANT
DRAPE SHOULDER BEACH CHAIR (DRAPES) ×3 IMPLANT
DRSG PAD ABDOMINAL 8X10 ST (GAUZE/BANDAGES/DRESSINGS) ×6 IMPLANT
DW OUTFLOW CASSETTE/TUBE SET (MISCELLANEOUS) ×3 IMPLANT
GAUZE SPONGE 4X4 12PLY STRL (GAUZE/BANDAGES/DRESSINGS) ×3 IMPLANT
GAUZE XEROFORM 1X8 LF (GAUZE/BANDAGES/DRESSINGS) ×3 IMPLANT
GLOVE BIO SURGEON STRL SZ 6.5 (GLOVE) ×2 IMPLANT
GLOVE BIO SURGEONS STRL SZ 6.5 (GLOVE) ×1
GLOVE BIOGEL PI IND STRL 6.5 (GLOVE) ×1 IMPLANT
GLOVE BIOGEL PI IND STRL 8 (GLOVE) ×1 IMPLANT
GLOVE BIOGEL PI INDICATOR 6.5 (GLOVE) ×2
GLOVE BIOGEL PI INDICATOR 8 (GLOVE) ×2
GLOVE ECLIPSE 8.0 STRL XLNG CF (GLOVE) ×3 IMPLANT
GOWN STRL REUS W/ TWL LRG LVL3 (GOWN DISPOSABLE) ×1 IMPLANT
GOWN STRL REUS W/TWL LRG LVL3 (GOWN DISPOSABLE) ×2
GOWN STRL REUS W/TWL XL LVL3 (GOWN DISPOSABLE) ×3 IMPLANT
KIT BASIN OR (CUSTOM PROCEDURE TRAY) ×3 IMPLANT
KIT INSERTION 2.9 PUSHLOCK (KITS) ×3 IMPLANT
KIT STABILIZATION SHOULDER (MISCELLANEOUS) ×3 IMPLANT
KIT STR SPEAR 1.8 FBRTK DISP (KITS) IMPLANT
KNOTLESS FIBERTAK ×3 IMPLANT
LASSO 90 CVE QUICKPAS (DISPOSABLE) IMPLANT
LASSO CRESCENT QUICKPASS (SUTURE) ×3 IMPLANT
MANIFOLD NEPTUNE II (INSTRUMENTS) ×3 IMPLANT
NDL SAFETY ECLIPSE 18X1.5 (NEEDLE) ×1 IMPLANT
NEEDLE HYPO 18GX1.5 SHARP (NEEDLE) ×3
PACK ARTHROSCOPY DSU (CUSTOM PROCEDURE TRAY) ×3 IMPLANT
PAD ORTHO SHOULDER 7X19 LRG (SOFTGOODS) ×3 IMPLANT
PORT APPOLLO RF 90DEGREE MULTI (SURGICAL WAND) IMPLANT
SLEEVE SCD COMPRESS KNEE MED (MISCELLANEOUS) ×3 IMPLANT
SLING ARM FOAM STRAP LRG (SOFTGOODS) IMPLANT
STRIP CLOSURE SKIN 1/2X4 (GAUZE/BANDAGES/DRESSINGS) ×2 IMPLANT
SUT FIBERWIRE #2 38 T-5 BLUE (SUTURE)
SUT LASSO 90 DEG SD STR (SUTURE) ×3 IMPLANT
SUT MNCRL AB 4-0 PS2 18 (SUTURE) ×3 IMPLANT
SUT TIGER TAPE 7 IN WHITE (SUTURE) IMPLANT
SUTURE FIBERWR #2 38 T-5 BLUE (SUTURE) IMPLANT
SUTURE TAPE TIGERLINK 1.3MM BL (SUTURE) IMPLANT
SUTURETAPE TIGERLINK 1.3MM BL (SUTURE)
SYR 3ML LL SCALE MARK (SYRINGE) ×3 IMPLANT
TAPE FIBER 2MM 7IN #2 BLUE (SUTURE) IMPLANT
TOWEL OR 17X26 10 PK STRL BLUE (TOWEL DISPOSABLE) ×3 IMPLANT
TUBING ARTHROSCOPY IRRIG 16FT (MISCELLANEOUS) ×3 IMPLANT
TUBING CONNECTING 10 (TUBING) ×2 IMPLANT
TUBING CONNECTING 10' (TUBING) ×1

## 2019-06-05 NOTE — Anesthesia Postprocedure Evaluation (Signed)
Anesthesia Post Note  Patient: Barbara Gilbert  Procedure(s) Performed: SHOULDER ARTHROSCOPY WITH BANKART REPAIR (Right Shoulder)     Patient location during evaluation: PACU Anesthesia Type: Regional and General Level of consciousness: awake and alert Pain management: pain level controlled Vital Signs Assessment: post-procedure vital signs reviewed and stable Respiratory status: spontaneous breathing, nonlabored ventilation, respiratory function stable and patient connected to nasal cannula oxygen Cardiovascular status: blood pressure returned to baseline and stable Postop Assessment: no apparent nausea or vomiting Anesthetic complications: no    Last Vitals:  Vitals:   06/05/19 1430 06/05/19 1445  BP: 103/65 113/78  Pulse: 78 98  Resp: 14 14  Temp: 36.8 C 36.8 C  SpO2: 94% 96%    Last Pain:  Vitals:   06/05/19 1445  TempSrc:   PainSc: 4                  Macallister Ashmead P Selisa Tensley

## 2019-06-05 NOTE — Transfer of Care (Signed)
Immediate Anesthesia Transfer of Care Note  Patient: Barbara Gilbert  Procedure(s) Performed: Procedure(s): SHOULDER ARTHROSCOPY WITH BANKART REPAIR (Right)  Patient Location: PACU  Anesthesia Type:General  Level of Consciousness:  sedated, patient cooperative and responds to stimulation  Airway & Oxygen Therapy:Patient Spontanous Breathing and Patient connected to face mask oxgen  Post-op Assessment:  Report given to PACU RN and Post -op Vital signs reviewed and stable  Post vital signs:  Reviewed and stable  Last Vitals:  Vitals:   06/05/19 1114 06/05/19 1116  BP:    Pulse: 66 71  Resp: (!) 24 (!) 22  Temp:    SpO2: 546% 503%    Complications: No apparent anesthesia complications

## 2019-06-05 NOTE — Op Note (Signed)
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  No posterior instability noted.  No obvious engagement of posterior Hill-Sachs lesion on exam. Articular space: No loose bodies, capsule intact, the anterior labrum was truncated and transected.  We are able to remove the small loose area that we were not able to reattach.  She had about 15% anterior bone loss that seems chronic.  Her labral tissue and capsular tissue had scarred medial on the glenoid neck similar to periosteal sleeve avulsion.  We able to use an elevator and freed this up and get a good repair of the labral tissue. Chondral surfaces:Intact, no sign of chondral degeneration on the glenoid or humeral head Biceps: Intact Subscapularis: Intact Superior Cuff: Intact  Successful completion of the planned procedure.  Good fixation with 5 anchors from the 3 o'clock position to the 7 o'clock position.   Post-operative plan: The patient will be non-weightbearing in a sling with therapy to start at 6 weeks postop.  The patient will be discharged home as anesthesia was comfortable with the patient's condition at the end of the case.  DVT prophylaxis not indicated in ambulatory upper extremity patient without known risk factor.   Pain control with PRN pain medication preferring oral medicines.  Follow up plan will be scheduled in approximately 7 days for incision check.   Post-Op Diagnosis: Same Surgeons:Primary: Hiram Gash, MD Assistants:Caroline McBane PA-C Location: [place] [facility] Anesthesia: General with Exparel interscalene block Antibiotics: Ancef 2 g Tourniquet time: None Estimated Blood Loss: Minimal Complications:  None Specimens: None Implants: Implant Name Type Inv. Item Serial No. Manufacturer Lot No. LRB No. Used Action  ANCHOR SUT 1.8 FIBERTAK 2 SUT - [id number] Anchor ANCHOR SUT 1.8 FIBERTAK 2 SUT  [employer] [id number] Right 2 Implanted  ANCHOR SUT 1.8 FIBERTAK 2 SUT - [id number] Anchor ANCHOR SUT 1.8 FIBERTAK 2 SUT  [employer] [id number] Right 2 Implanted  ANCHOR SUT 1.8 FIBERTAK 2 SUT - [id number] Anchor ANCHOR SUT 1.8 FIBERTAK 2 SUT  [employer] [id number] Right 1 Implanted    Indications for Surgery:   Saraphina Winstanley is a 33 y.o. female with continued shoulder pain refractory to nonoperative measures for extended period of time.  She had continued instability and was having dislocations even with low energy events like rolling over in bed.  She had not had a full neurology work-up after she had her first seizure with a fall off of a porch and a concussion type injury.  That said based on the patient's continued work-up she had been stable and her young age and continued stability put her at a high risk of post instability arthrosis we felt that proceeding with surgery in the main hospital setting would be safe.  The risks and benefits were explained at length including but not limited to continued pain, cuff failure, biceps tenodesis failure, stiffness, need for further surgery and infection as well as continued instability.   Procedure:   Patient was correctly identified in the preoperative holding area and operative site marked.  Patient brought to OR and positioned lateral with a beanbag and [employer] lateral positioner ensuring that all bony prominences were padded and the head was in an appropriate location.  Anesthesia was induced and the operative shoulder was prepped  and draped in the usual sterile fashion.  Timeout was called preincision.  A standard posterior viewing portal was made after localizing the portal with a spinal needle.  An anterior accessory portal was also made.  After clearing the  articular space the camera was positioned in the subacromial space.  Findings above.    We began by making our portals including an anterior inferior portal just above the subscap by placing a 6 spinal needle then switching stick and placing a cannula.  We made an anterior accessory portal just above this just below the biceps.  Once these were performed formed we switched the camera to the anterior portal and were able to place a posterior superior and posterior inferior portal.  The posterior inferior portal was made with a percutaneous kit made by [employer].  Once portals were made we began by assessing our tissue.  Findings are above.  There is truncation of the anterior labrum we are able to repair the remainder of the labrum without issue.  There is essentially the anterior inferior position the had transected and we had to remove proximally labrum from 3:00 to 430 as it was completely devoid of soft tissue attachment but we were able to repair the rest.  Good capsular repair performed anteriorly.  This point we began by placing anchors.  We started at the 7:00 o'clock position and placed a knotless 1.8 mm Fibertak anchor shuttling sutures in typical fashion obtaining good purchase of the capsule labral tissue.  We repeated this process moving anteriorly placing 5 anchors between 7:00 and 3:00 good purchase of the tissue was obtained the tissue quality was poor.  The head was centered at the finish of the case.  We did examine the Hill-Sachs lesion and were able to note on full external rotation that it did not engage anteriorly and thus did not perform a remplissage procedure.   The incisions were closed with absorbable monocryl and steri strips.  A sterile dressing was placed along with a sling. The patient was awoken from general anesthesia and taken to the PACU in stable condition without complication.   Noemi Chapel, PA-C, present and scrubbed throughout the case, critical for completion  in a timely fashion, and for retraction, instrumentation, closure.

## 2019-06-05 NOTE — Anesthesia Preprocedure Evaluation (Addendum)
Anesthesia Evaluation  Patient identified by MRN, date of birth, ID band Patient awake    Reviewed: Allergy & Precautions, NPO status , Patient's Chart, lab work & pertinent test results  Airway Mallampati: II  TM Distance: >3 FB Neck ROM: Full    Dental no notable dental hx.    Pulmonary Patient abstained from smoking., former smoker,    Pulmonary exam normal breath sounds clear to auscultation       Cardiovascular negative cardio ROS Normal cardiovascular exam Rhythm:Regular Rate:Normal  ECG: SR, short PR   Neuro/Psych Seizures -, Well Controlled,  PSYCHIATRIC DISORDERS Anxiety PTSD (post-traumatic stress disorder   GI/Hepatic negative GI ROS, Neg liver ROS,   Endo/Other  negative endocrine ROS  Renal/GU negative Renal ROS     Musculoskeletal negative musculoskeletal ROS (+)   Abdominal   Peds  Hematology negative hematology ROS (+)   Anesthesia Other Findings RIGHT SHOULDER DISLOCATION  Reproductive/Obstetrics S/p BTL                            Anesthesia Physical Anesthesia Plan  ASA: II  Anesthesia Plan: General and Regional   Post-op Pain Management: GA combined w/ Regional for post-op pain   Induction: Intravenous  PONV Risk Score and Plan: 3 and Midazolam, Dexamethasone, Ondansetron and Treatment may vary due to age or medical condition  Airway Management Planned: Oral ETT  Additional Equipment:   Intra-op Plan:   Post-operative Plan: Extubation in OR  Informed Consent: I have reviewed the patients History and Physical, chart, labs and discussed the procedure including the risks, benefits and alternatives for the proposed anesthesia with the patient or authorized representative who has indicated his/her understanding and acceptance.     Dental advisory given  Plan Discussed with: CRNA  Anesthesia Plan Comments:        Anesthesia Quick Evaluation

## 2019-06-05 NOTE — Anesthesia Procedure Notes (Signed)
Procedure Name: Intubation Date/Time: 06/05/2019 11:43 AM Performed by: Lavina Hamman, CRNA Pre-anesthesia Checklist: Patient identified, Emergency Drugs available, Suction available, Patient being monitored and Timeout performed Patient Re-evaluated:Patient Re-evaluated prior to induction Oxygen Delivery Method: Circle system utilized Preoxygenation: Pre-oxygenation with 100% oxygen Induction Type: IV induction Ventilation: Mask ventilation without difficulty Laryngoscope Size: Mac and 3 Grade View: Grade I Tube type: Oral Tube size: 7.0 mm Number of attempts: 1 Airway Equipment and Method: Stylet Placement Confirmation: ETT inserted through vocal cords under direct vision,  positive ETCO2,  CO2 detector and breath sounds checked- equal and bilateral Secured at: 22 cm Tube secured with: Tape Dental Injury: Teeth and Oropharynx as per pre-operative assessment

## 2019-06-05 NOTE — Interval H&P Note (Signed)
Talk to patient my procedure. Plan is for a patient to discharge after surgery as long as she stable. All questions answered.

## 2019-06-05 NOTE — Progress Notes (Signed)
Assisted Dr. Ellender with right, ultrasound guided, interscalene  block. Side rails up, monitors on throughout procedure. See vital signs in flow sheet. Tolerated Procedure well. 

## 2019-06-05 NOTE — Anesthesia Procedure Notes (Signed)
Anesthesia Regional Block: Interscalene brachial plexus block   Pre-Anesthetic Checklist: ,, timeout performed, Correct Patient, Correct Site, Correct Laterality, Correct Procedure, Correct Position, site marked, Risks and benefits discussed,  Surgical consent,  Pre-op evaluation,  At surgeon's request and post-op pain management  Laterality: Right  Prep: chloraprep       Needles:  Injection technique: Single-shot  Needle Type: Echogenic Stimulator Needle     Needle Length: 9cm  Needle Gauge: 21     Additional Needles:   Procedures:,,,, ultrasound used (permanent image in chart),,,,  Narrative:  Start time: 06/05/2019 11:05 AM End time: 06/05/2019 11:15 AM Injection made incrementally with aspirations every 5 mL.  Performed by: Personally  Anesthesiologist: Murvin Natal, MD  Additional Notes: Functioning IV was confirmed and monitors were applied.  A timeout was performed. Sterile prep, hand hygiene and sterile gloves were used. A 65mm 21ga Arrow echogenic stimulator needle was used. Negative aspiration and negative test dose prior to incremental administration of local anesthetic. The patient tolerated the procedure well.  Ultrasound guidance: relevent anatomy identified, needle position confirmed, local anesthetic spread visualized around nerve(s), vascular puncture avoided.  Image printed for medical record.

## 2019-06-10 ENCOUNTER — Telehealth: Payer: Self-pay | Admitting: *Deleted

## 2019-06-10 NOTE — Telephone Encounter (Signed)
LMOM to call [phone] to schedule 48 hour ambulatory EEG

## 2019-06-11 ENCOUNTER — Encounter (HOSPITAL_COMMUNITY): Payer: Self-pay | Admitting: Orthopaedic Surgery

## 2019-06-12 ENCOUNTER — Other Ambulatory Visit: Payer: Medicaid Other

## 2019-06-14 ENCOUNTER — Ambulatory Visit: Payer: Self-pay | Admitting: Surgery

## 2019-06-14 NOTE — H&P (Signed)
Barbara Gilbert Documented: 06/14/2019 12:08 PM Location: [facility] Patient #: [id number] DOB: [birth year] Single / Language: [place] / Race: Refused to Report/Unreported Female  History of Present Illness Barbara Gilbert Fus A. [name] MD; 06/14/2019 12:25 PM) Patient words: Patient returns for follow-up of her 5 cm right thyroid nodule. She underwent FNA which showed atypical cells. Significance was unknown. This was scored as a Bethesda 3. She is recently had right shoulder surgery and is in a sling until the mid December timeframe. I discussed potential risks 11 cc according to the literature of roughly 25% with a scoring system. She denies any other complaints.  The patient is a 33 year old female.   Past Surgical History Barbara Gilbert, [place]; 06/14/2019 12:08 PM) No pertinent past surgical history  Diagnostic Studies History Barbara Gilbert, [place]; 06/14/2019 12:08 PM) Colonoscopy never Mammogram within last year Pap Smear 1-5 years ago  Allergies Barbara Gilbert, CMA; 06/14/2019 12:09 PM) No Known Drug Allergies [04/26/2019]: Allergies Reconciled  Medication History Barbara Gilbert, CMA; 06/14/2019 12:09 PM) Cyclobenzaprine HCl (5MG  Tablet, Oral) Active. oxyCODONE HCl (5MG  Tablet, Oral) Active. Divalproex Sodium (250MG  Tablet DR, Oral) Active. Meloxicam (7.5MG  Tablet, Oral) Active. Ondansetron HCl (4MG  Tablet, Oral) Active. Medications Reconciled  Social History , ; 06/14/2019 12:08 PM) Alcohol use Occasional alcohol use. Caffeine use Coffee. No drug use Tobacco use Former smoker.  Family History , Barbara Gilbert; 06/14/2019 12:08 PM) Alcohol Abuse Father, Mother.  Pregnancy / Birth History 13/01/2019, Barbara Gilbert; 06/14/2019 12:08 PM) Age at menarche 13 years. Gravida 3 Length (months) of breastfeeding 3-6 Maternal age 40-20 Para 1 Regular periods  Other Problems Barbara Gilbert, CMA; 06/14/2019 12:08 PM) No pertinent  past medical history     Review of Systems 13/01/2019 CMA; 06/14/2019 12:08 PM) General Not Present- Appetite Loss, Chills, Fatigue, Fever, Night Sweats, Weight Gain and Weight Loss. Skin Not Present- Change in Wart/Mole, Dryness, Hives, Jaundice, New Lesions, Non-Healing Wounds, Rash and Ulcer. HEENT Not Present- Earache, Hearing Loss, Hoarseness, Nose Bleed, Oral Ulcers, Ringing in the Ears, Seasonal Allergies, Sinus Pain, Sore Throat, Visual Disturbances, Wears glasses/contact lenses and Yellow Eyes. Respiratory Not Present- Bloody sputum, Chronic Cough, Difficulty Breathing, Snoring and Wheezing. Breast Not Present- Breast Mass, Breast Pain, Nipple Discharge and Skin Changes. Cardiovascular Not Present- Chest Pain, Difficulty Breathing Lying Down, Leg Cramps, Palpitations, Rapid Heart Rate, Shortness of Breath and Swelling of Extremities. Gastrointestinal Not Present- Abdominal Pain, Bloating, Bloody Stool, Change in Bowel Habits, Chronic diarrhea, Constipation, Difficulty Swallowing, Excessive gas, Gets full quickly at meals, Hemorrhoids, Indigestion, Nausea, Rectal Pain and Vomiting. Female Genitourinary Not Present- Frequency, Nocturia, Painful Urination, Pelvic Pain and Urgency. Musculoskeletal Not Present- Back Pain, Joint Pain, Joint Stiffness, Muscle Pain, Muscle Weakness and Swelling of Extremities. Neurological Not Present- Decreased Memory, Fainting, Headaches, Numbness, Seizures, Tingling, Tremor, Trouble walking and Weakness. Psychiatric Not Present- Anxiety, Bipolar, Change in Sleep Pattern, Depression, Fearful and Frequent crying. Endocrine Not Present- Cold Intolerance, Excessive Hunger, Hair Changes, Heat Intolerance, Hot flashes and New Diabetes. Hematology Not Present- Blood Thinners, Easy Bruising, Excessive bleeding, Gland problems, HIV and Persistent Infections.  Vitals Barbara Gilbert CMA; 06/14/2019 12:09 PM) 06/14/2019 12:09 PM Weight: 136.2 lb Height:  63in Body Surface Area: 1.64 m Body Mass Index: 24.13 kg/m  Temp.: 98.62F  Pulse: 76 (Regular)  BP: 110/70 (Sitting, Left Arm, Standard)        Physical Exam (Barbara Gilbert A. Barbara Chick MD; 06/14/2019 12:26 PM)  General Mental Status-Alert. General Appearance-Consistent with stated age. Hydration-Well hydrated. Voice-Normal.  Head and Neck Note: Right thyroid nodule. Mobile. Trachea midline.  Neurologic Neurologic evaluation reveals -alert and oriented x 3 with no impairment of recent or remote memory. Mental Status-Normal.  Musculoskeletal Note: Right arm sling  Lymphatic Head & Neck  General Head & Neck Lymphatics: Bilateral - Description - Normal.    Assessment & Plan (Barbara Gilbert A. 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 Risk of malignancy 26%. Discussed surgical and medical options of management including close follow-up. She would like to proceed with total thyroidectomy. Discussed lobectomy as well she would rather have a total thyroidectomy if she require surgery.  Risks of surgery include bleeding, infection, low calcium, nerve injury causing hoarseness or airway problems, injury to neighboring structures,blood clots stroke cardiac issues and need for other treatments.  Current Plans Pt Education - Pamphlet Given - The Thyroid Book: discussed with patient and provided information. You are being scheduled for surgery- Our schedulers will call you.  You should hear from our office's scheduling department within 5 working days about the location, date, and time of surgery. We try to make accommodations for patient's preferences in scheduling surgery, but sometimes the OR schedule or the surgeon's schedule prevents [place] from making those accommodations.  If you have not heard from our office [phone]) in 5 working days, call the office and ask for your surgeon's nurse.  If you have other  questions about your diagnosis, plan, or surgery, call the office and ask for your surgeon's nurse.  Schedule for Surgery The anatomy and physiology of the thyroid gland and organs of the neck were discussed. Pathophysiology of thyroid problems were discussed. Options were discussed, and I made a recommendation to remove part (and possibly all) of the thyroid gland to treat the pathology.  Risks of bleeding, infection, injury to other organs including nerves, reoperation, death, and other risks were discussed. I noted a good likelihood this will help address the problem. While there are risks, I feel the risks of nonoperative management are greater; therefore, I feel surgery offers the best option. Educational material was given to help further explain the topics & concerns from our discussion. We will work to minimize complications.

## 2019-06-17 ENCOUNTER — Other Ambulatory Visit: Payer: Medicaid Other

## 2019-06-19 ENCOUNTER — Encounter (HOSPITAL_COMMUNITY): Payer: Self-pay

## 2019-07-08 ENCOUNTER — Other Ambulatory Visit: Payer: Medicaid Other

## 2019-07-23 ENCOUNTER — Encounter: Payer: Self-pay | Admitting: Physical Therapy

## 2019-07-23 ENCOUNTER — Other Ambulatory Visit: Payer: Self-pay

## 2019-07-23 ENCOUNTER — Ambulatory Visit: Payer: Medicaid Other | Attending: Orthopaedic Surgery | Admitting: Physical Therapy

## 2019-07-23 DIAGNOSIS — G8929 Other chronic pain: Secondary | ICD-10-CM | POA: Insufficient documentation

## 2019-07-23 DIAGNOSIS — M25511 Pain in right shoulder: Secondary | ICD-10-CM | POA: Diagnosis present

## 2019-07-23 DIAGNOSIS — M6281 Muscle weakness (generalized): Secondary | ICD-10-CM | POA: Diagnosis present

## 2019-07-23 DIAGNOSIS — Z9889 Other specified postprocedural states: Secondary | ICD-10-CM

## 2019-07-23 DIAGNOSIS — M25611 Stiffness of right shoulder, not elsewhere classified: Secondary | ICD-10-CM | POA: Insufficient documentation

## 2019-07-23 DIAGNOSIS — R600 Localized edema: Secondary | ICD-10-CM | POA: Diagnosis present
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
## 2019-07-30 ENCOUNTER — Encounter (HOSPITAL_COMMUNITY): Payer: Self-pay | Admitting: Physician Assistant

## 2019-08-02 ENCOUNTER — Encounter

## 2019-08-06 ENCOUNTER — Ambulatory Visit: Payer: Medicaid Other | Admitting: Physical Therapy

## 2019-08-07 ENCOUNTER — Ambulatory Visit: Payer: Medicaid Other | Admitting: Physical Therapy

## 2019-08-08 ENCOUNTER — Encounter: Payer: Self-pay | Admitting: Physical Therapy

## 2019-08-08 ENCOUNTER — Ambulatory Visit: Payer: Medicaid Other | Admitting: Physical Therapy

## 2019-08-08 ENCOUNTER — Other Ambulatory Visit: Payer: Self-pay

## 2019-08-08 DIAGNOSIS — G8929 Other chronic pain: Secondary | ICD-10-CM

## 2019-08-08 DIAGNOSIS — M25611 Stiffness of right shoulder, not elsewhere classified: Secondary | ICD-10-CM

## 2019-08-08 DIAGNOSIS — Z9889 Other specified postprocedural states: Secondary | ICD-10-CM | POA: Diagnosis not present

## 2019-08-08 DIAGNOSIS — M6281 Muscle weakness (generalized): Secondary | ICD-10-CM

## 2019-08-08 DIAGNOSIS — R600 Localized edema: Secondary | ICD-10-CM

## 2019-08-08 NOTE — Therapy (Signed)
[facility] [address] [place], [place], [address] Phone: [phone]   Fax:  [phone]  Physical Therapy Treatment  Patient Details  Name: Barbara Gilbert MRN: [id number] Date of Birth: [birth year] Referring Provider (PT): Ramond Marrow Md   Encounter Date: 08/08/2019  PT End of Session - 08/08/19 1629    Visit Number  2    Number of Visits  17    Date for PT Re-Evaluation  09/17/19    Authorization Type  MCD: resubmit at 4th visit    Authorization Time Period  07/26/2019 - 08/08/2019    Authorization - Visit Number  1    Authorization - Number of Visits  3    PT Start Time  1630    PT Stop Time  1710    PT Time Calculation (min)  40 min    Activity Tolerance  Patient tolerated treatment well    Behavior During Therapy  [facility] for tasks assessed/performed       Past Medical History:  Diagnosis Date  . Anxiety attack   . Panic disorder   . 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 Seizure (HCC) 03/11/2019   seizure x 1 after fall down stair and loss consciousness; ER visit and observation overnight in hospital  . Seizure disorder [facility])    sees Dr. Patrcia Dolly    Past Surgical History:  Procedure Laterality Date  . abortions     X 2----2012 and 2014  . BIOPSY THYROID     1 week ago October 2020; awaiting result  . LAPAROSCOPIC TUBAL LIGATION Bilateral 10/04/2017   Procedure: LAPAROSCOPIC TUBAL LIGATION WITH FILSHIE CLIPS;  Surgeon: Adam Phenix, MD;  Location: [facility];  Service: Gynecology;  Laterality: Bilateral;  . SHOULDER ARTHROSCOPY WITH BANKART REPAIR Right 06/05/2019   Procedure: SHOULDER ARTHROSCOPY WITH BANKART REPAIR;  Surgeon: Bjorn Pippin, MD;  Location: [facility];  Service: Orthopedics;  Laterality: Right;    There were no vitals filed for this visit.  Subjective Assessment - 08/08/19 1631    Subjective  "I am doing pretty good today no pain,  consistent with HEP and it helps"    Currently in Pain?  No/denies    Pain Score  0-No pain    Aggravating Factors   unsure                       [facility] Adult PT Treatment/Exercise - 08/08/19 0001      Shoulder Exercises: Supine   Protraction  Both;10 reps   with dowel rod x 2 sets   Protraction Limitations  demonstration for proper form    Other Supine Exercises  wand flexion AAROM 2 x 10 with dowel rod   working into end range as able     Shoulder Exercises: Standing   External Rotation  Strengthening;15 reps;Theraband    Theraband Level (Shoulder External Rotation)  Level 1 (Yellow)    External Rotation Limitations  frequent cues to keen elbow bent at 90 degrees     Internal Rotation  Strengthening;15 reps;Theraband    Theraband Level (Shoulder Internal Rotation)  Level 2 (Red)    Row  15 reps;Strengthening;Both;Theraband   x 2 sets   Theraband Level (Shoulder Row)  Level 1 (Yellow)    Row Limitations  cues to avoid shoulder hiking      Shoulder Exercises: ROM/Strengthening   Other ROM/Strengthening Exercises  wall  ladder 1 x 5 flexion, 1 x 5 scaption angle with controlled eccentric lowering      Manual Therapy   Manual Therapy  Passive ROM;Joint mobilization    Joint Mobilization  grade III GHJ PA and inferior mobs     Passive ROM  flexion/ abduction work into end of available ROM with gentle distraction oscillation for pain               PT Short Term Goals - 07/23/19 1740      PT SHORT TERM GOAL #1   Title  pt to be I with intial HEP    Baseline  no previous HEP    Time  2    Period  Weeks    Status  New    Target Date  08/06/19      PT SHORT TERM GOAL #2   Title  pt to verbalize and demo techniques to contorl and reduce pain via RICE and HEP    Baseline  only changes position to control pain    Time  2    Period  Weeks    Status  New    Target Date  08/06/19        PT Long Term Goals - 07/23/19 1740      PT LONG TERM GOAL #1    Title  pt to increase R shoulder AROM flexion/ abduction to >/= 145 degrees, and ER/ IR by >/= 30 degrees    Baseline  ER 10 degrees, IR 60 degrees, flexion 106, abduction 76 degrees    Time  8    Period  Weeks    Status  New    Target Date  09/17/19      PT LONG TERM GOAL #2   Title  pt to increase gross  R shoulder strength to >/= 4+/5 promote shoulder stability    Time  8    Period  Weeks    Status  New    Target Date  09/17/19      PT LONG TERM GOAL #3   Title  pt to be able to return to personal exercies and her current work with no limitations or pain    Time  8    Period  Weeks    Status  New    Target Date  09/17/19      PT LONG TERM GOAL #4   Title  improve quickDash by >/= 10 points to demo improvement in RUE function    Baseline  inital score 22.72    Time  8    Period  Weeks    Status  New    Target Date  09/17/19      PT LONG TERM GOAL #5   Title  pt to be I with all HEP given as of last visit to maintain and progress current level function    Baseline  no previous HEP    Time  8    Period  Weeks    Status  New    Target Date  09/17/19            Plan - 08/08/19 1711    Clinical Impression Statement  Mrs Barbara Gilbert demonstrates consistency with her HEP and additionaly notes no pain today. she is a little over 12 weeks post-op today and continued working on shoulder ROM and progressing to isotonic exercises which she did well but required frequent cues/ demonstration for proper form.plan to re-evaluate/ resubmit to MCD next  session.    PT Treatment/Interventions  ADLs/Self Care Home Management;Cryotherapy;Electrical Stimulation;Iontophoresis 4mg /ml Dexamethasone;Moist Heat;Ultrasound;Therapeutic activities;Therapeutic exercise;Patient/family education;Manual techniques;Passive range of motion;Taping;Vasopneumatic Device;Neuromuscular re-education    PT Next Visit Plan  MCD ERO/resubmission STW, Shoulder mobs  PROM > AAROM, scapular setting, modalities PRN. ( see  protocol for [employer] repair will be 13 weeks post-op on 08/14/2019)    PT Home Exercise Plan  [id number] (updated HEP code )  - wand flexion/ abduction and ER, upper trap stretching, 4-way shoulder isometrics, internal / external rotation, rows, scapular protraction, rows       Patient will benefit from skilled therapeutic intervention in order to improve the following deficits and impairments:  Improper body mechanics, Decreased strength, Postural dysfunction, Pain, Impaired UE functional use, Decreased endurance, Decreased activity tolerance, Decreased range of motion, Increased edema  Visit Diagnosis: Status post arthroscopy of right shoulder  Muscle weakness (generalized)  Chronic right shoulder pain  Stiffness of right shoulder, not elsewhere classified  Localized edema     Problem List Patient Active Problem List   Diagnosis Date Noted  . 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 [place], [place], [address] Phone: [phone]   Fax:  [phone]  Name: Barbara Gilbert MRN: [id number] Date of Birth: August 04, 1986

## 2019-08-14 ENCOUNTER — Other Ambulatory Visit: Payer: Self-pay

## 2019-08-14 ENCOUNTER — Encounter: Payer: Self-pay | Admitting: Physical Therapy

## 2019-08-14 ENCOUNTER — Ambulatory Visit: Payer: Medicaid Other | Attending: Orthopaedic Surgery | Admitting: Physical Therapy

## 2019-08-14 DIAGNOSIS — M25511 Pain in right shoulder: Secondary | ICD-10-CM | POA: Diagnosis present

## 2019-08-14 DIAGNOSIS — M6281 Muscle weakness (generalized): Secondary | ICD-10-CM | POA: Diagnosis present

## 2019-08-14 DIAGNOSIS — G8929 Other chronic pain: Secondary | ICD-10-CM

## 2019-08-14 DIAGNOSIS — Z9889 Other specified postprocedural states: Secondary | ICD-10-CM | POA: Diagnosis not present

## 2019-08-14 DIAGNOSIS — M25611 Stiffness of right shoulder, not elsewhere classified: Secondary | ICD-10-CM | POA: Insufficient documentation

## 2019-08-14 DIAGNOSIS — R600 Localized edema: Secondary | ICD-10-CM

## 2019-08-15 NOTE — Therapy (Signed)
[place], [place], [address] Phone: [phone]   Fax:  [phone]  Physical Therapy Treatment  Patient Details  Name: Barbara Gilbert MRN: [id number] Date of Birth: 12-15-1985 Referring Provider (PT): Ophelia Charter Md   Encounter Date: 08/14/2019  PT End of Session - 08/14/19 1701    Visit Number  3    Number of Visits  6    Date for PT Re-Evaluation  09/12/19    Authorization Type  MCD: resubmit at 4th visit    Authorization Time Period  asking for 3 more visits    PT Start Time  1632    PT Stop Time  1714    PT Time Calculation (min)  42 min    Activity Tolerance  Patient tolerated treatment well    Behavior During Therapy  [facility] for tasks assessed/performed       Past Medical History:  Diagnosis Date  . Anxiety attack   . Panic disorder   . 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 Seizure ([place]) 03/11/2019   seizure x 1 after fall down stair and loss consciousness; ER visit and observation overnight in hospital  . Seizure disorder [facility])    sees Dr. Ellouise Newer    Past Surgical History:  Procedure Laterality Date  . abortions     X 2----2012 and 2014  . BIOPSY THYROID     1 week ago October 2020; awaiting result  . LAPAROSCOPIC TUBAL LIGATION Bilateral 10/04/2017   Procedure: LAPAROSCOPIC TUBAL LIGATION WITH FILSHIE CLIPS;  Surgeon: Woodroe Mode, MD;  Location: [facility];  Service: Gynecology;  Laterality: Bilateral;  . SHOULDER ARTHROSCOPY WITH BANKART REPAIR Right 06/05/2019   Procedure: SHOULDER ARTHROSCOPY WITH BANKART REPAIR;  Surgeon: Hiram Gash, MD;  Location[facility];  Service: Orthopedics;  Laterality: Right;    There were no vitals filed for this visit.  Subjective Assessment - 08/14/19 1638    Subjective  Patient reports her shoulder has been doing well. She isn;t having any pain right now. She has been doing her exercises.     How long can you sit comfortably?  unlimited    How long can you walk comfortably?  unlimited    Diagnostic tests  MRI at md    Patient Stated Goals  To get back to work,    Currently in Pain?  No/denies         [facility] PT Assessment - 08/15/19 0001      PROM   Right Shoulder Flexion  140 Degrees    Right Shoulder ABduction  98 Degrees    Right Shoulder Internal Rotation  68 Degrees    Right Shoulder External Rotation  38 Degrees      Strength   Right Shoulder Flexion  4/5    Right Shoulder Internal Rotation  4/5    Right Shoulder External Rotation  4/5      Palpation   Palpation comment  no TTTP                   [facility] Adult PT Treatment/Exercise - 08/15/19 0001      Shoulder Exercises: Supine   Protraction  Both;10 reps   with dowel rod x 2 sets   Other Supine Exercises  wand flexion AAROM 2 x 10 with dowel rod   working into end range as able   Other Supine Exercises  wand  ER with cuing not to push to hard just to maintain motion       Shoulder Exercises: Standing   External Rotation  Strengthening;15 reps;Theraband    Theraband Level (Shoulder External Rotation)  Level 1 (Yellow)    Internal Rotation  Strengthening;15 reps;Theraband    Theraband Level (Shoulder Internal Rotation)  Level 2 (Red)    Extension  20 reps    Theraband Level (Shoulder Extension)  Level 2 (Red)    Row  20 reps    Theraband Level (Shoulder Row)  Level 2 (Red)      Shoulder Exercises: ROM/Strengthening   Other ROM/Strengthening Exercises  wall ladder 1 x 5 flexion, 1 x 5 scaption angle with controlled eccentric lowering      Manual Therapy   Joint Mobilization  grade II III GHJ AP and inferior mobs     Passive ROM  gentle ER; flexion and IR              PT Education - 08/14/19 1701    Education Details  reviewed HEpand symptom mangement    Person(s) Educated  Patient    Methods  Explanation;Demonstration;Tactile cues;Verbal cues    Comprehension  Verbalized  understanding;Returned demonstration;Verbal cues required;Tactile cues required       PT Short Term Goals - 08/15/19 1455      PT SHORT TERM GOAL #1   Title  pt to be I with intial HEP    Baseline  indepdnent with basic HEP    Time  2    Period  Weeks    Target Date  08/06/19      PT SHORT TERM GOAL #2   Title  pt to verbalize and demo techniques to contorl and reduce pain via RICE and HEP    Baseline  only changes position to control pain    Time  2    Period  Weeks    Status  Achieved        PT Long Term Goals - 08/15/19 1456      PT LONG TERM GOAL #1   Title  pt to increase R shoulder AROM flexion/ abduction to >/= 145 degrees, and ER/ IR by >/= 30 degrees    Baseline  ER 30 degrees, IR 68 degrees, flexion 136, abduction 94degrees    Time  8    Period  Weeks    Status  On-going      PT LONG TERM GOAL #2   Title  pt to increase gross  R shoulder strength to >/= 4+/5 promote shoulder stability    Baseline  improved to 4/5    Time  8    Period  Weeks    Status  On-going      PT LONG TERM GOAL #3   Title  pt to be able to return to personal exercies and her current work with no limitations or pain    Baseline  not bale to return to exercises    Time  8    Period  Weeks    Status  On-going      PT LONG TERM GOAL #4   Title  improve quickDash by >/= 10 points to demo improvement in RUE function    Baseline  inital score 22.72    Time  8    Period  Weeks    Status  On-going      PT LONG TERM GOAL #5   Title  pt to be I with all  HEP given as of last visit to maintain and progress current level function    Baseline  still adding to HEP    Time  8    Period  Weeks    Status  On-going            Plan - 08/14/19 1703    Clinical Impression Statement  Patient is making good progress. Due to mediciad restrictions her visits have been inconsitent but she has been working on her exercises at home. Her passive ER has improved but is still limited. Her active  flexion has improved significantly. She is makinbg good progress. Her strength has improved to 4/5 in all plains. She would benefit from further skilled therapy to improve her functional mobility of her right shoulder.    Personal Factors and Comorbidities  Comorbidity 1    Comorbidities  hx of depression    Stability/Clinical Decision Making  Stable/Uncomplicated    Clinical Decision Making  Low    Rehab Potential  Good    PT Frequency  2x / week    PT Duration  8 weeks    PT Treatment/Interventions  ADLs/Self Care Home Management;Cryotherapy;Electrical Stimulation;Iontophoresis 4mg /ml Dexamethasone;Moist Heat;Ultrasound;Therapeutic activities;Therapeutic exercise;Patient/family education;Manual techniques;Passive range of motion;Taping;Vasopneumatic Device;Neuromuscular re-education    PT Next Visit Plan  MCD ERO/resubmission STW, Shoulder mobs  PROM > AAROM, scapular setting, modalities PRN. ( see protocol for [employer] repair will be 13 weeks post-op on 08/14/2019)    PT Home Exercise Plan  [id number] (updated HEP code )  - wand flexion/ abduction and ER, upper trap stretching, 4-way shoulder isometrics, internal / external rotation, rows, scapular protraction, rows    Consulted and Agree with Plan of Care  Patient       Patient will benefit from skilled therapeutic intervention in order to improve the following deficits and impairments:  Improper body mechanics, Decreased strength, Postural dysfunction, Pain, Impaired UE functional use, Decreased endurance, Decreased activity tolerance, Decreased range of motion, Increased edema  Visit Diagnosis: Status post arthroscopy of right shoulder  Muscle weakness (generalized)  Chronic right shoulder pain  Stiffness of right shoulder, not elsewhere classified  Localized edema     Problem List Patient Active Problem List   Diagnosis Date Noted  . 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

## 2019-08-15 NOTE — Addendum Note (Signed)
Addended by: Dessie Coma on: 08/15/2019 03:04 PM   Modules accepted: Orders

## 2019-08-17 ENCOUNTER — Other Ambulatory Visit (HOSPITAL_COMMUNITY): Payer: Medicaid Other

## 2019-08-22 ENCOUNTER — Ambulatory Visit: Payer: Medicaid Other | Admitting: Physical Therapy

## 2019-08-28 ENCOUNTER — Encounter: Payer: Self-pay | Admitting: Physical Therapy

## 2019-08-28 ENCOUNTER — Other Ambulatory Visit: Payer: Self-pay

## 2019-08-28 ENCOUNTER — Ambulatory Visit: Payer: Medicaid Other | Admitting: Physical Therapy

## 2019-08-28 DIAGNOSIS — M6281 Muscle weakness (generalized): Secondary | ICD-10-CM

## 2019-08-28 DIAGNOSIS — R600 Localized edema: Secondary | ICD-10-CM

## 2019-08-28 DIAGNOSIS — Z9889 Other specified postprocedural states: Secondary | ICD-10-CM

## 2019-08-28 DIAGNOSIS — G8929 Other chronic pain: Secondary | ICD-10-CM

## 2019-08-28 DIAGNOSIS — M25611 Stiffness of right shoulder, not elsewhere classified: Secondary | ICD-10-CM

## 2019-08-29 ENCOUNTER — Encounter: Payer: Self-pay | Admitting: Physical Therapy

## 2019-08-29 NOTE — Therapy (Signed)
[facility]patient Rehabilitation [facility] [address] [place], [place], [address] Phone: [phone]   Fax:  [phone]  Physical Therapy Treatment/ Progress note  Patient Details  Name: Barbara Gilbert MRN: [id number] Date of Birth: 04/28/1986 Referring Provider (PT): Ramond Marrow Md   Encounter Date: 08/28/2019  PT End of Session - 08/29/19 1329    Visit Number  4    Number of Visits  6    Date for PT Re-Evaluation  09/12/19    Authorization Type  MCD: resubmit at 4th visit    Authorization Time Period  asking for 3 more visits    Authorization - Visit Number  1    Authorization - Number of Visits  3    PT Start Time  1633    PT Stop Time  1714    PT Time Calculation (min)  41 min    Activity Tolerance  Patient tolerated treatment well    Behavior During Therapy  [facility] for tasks assessed/performed       Past Medical History:  Diagnosis Date  . Anxiety attack   . Panic disorder   . 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 Seizure (HCC) 03/11/2019   seizure x 1 after fall down stair and loss consciousness; ER visit and observation overnight in hospital  . Seizure disorder [facility])    sees Dr. Patrcia Dolly    Past Surgical History:  Procedure Laterality Date  . abortions     X 2----2012 and 2014  . BIOPSY THYROID     1 week ago October 2020; awaiting result  . LAPAROSCOPIC TUBAL LIGATION Bilateral 10/04/2017   Procedure: LAPAROSCOPIC TUBAL LIGATION WITH FILSHIE CLIPS;  Surgeon: Adam Phenix, MD;  Location: [facility];  Service: Gynecology;  Laterality: Bilateral;  . SHOULDER ARTHROSCOPY WITH BANKART REPAIR Right 06/05/2019   Procedure: SHOULDER ARTHROSCOPY WITH BANKART REPAIR;  Surgeon: Bjorn Pippin, MD;  [facility];  Service: Orthopedics;  Laterality: Right;    There were no vitals filed for this visit.  Subjective Assessment - 08/28/19 1639    Subjective  Patient reports the shoulder  hasbeen doing well. She has a little soreness when she does her exercises. Her wrist has been painful. She is now wearing a brace on her wrist.    Limitations  Lifting    How long can you sit comfortably?  unlimited    How long can you stand comfortably?  unlimited    How long can you walk comfortably?  unlimited    Diagnostic tests  MRI at md    Patient Stated Goals  To get back to work,    Pain Score  8     Pain Location  Wrist    Pain Orientation  Right    Pain Descriptors / Indicators  Aching    Pain Type  Chronic pain    Pain Onset  More than a month ago    Pain Frequency  Intermittent    Aggravating Factors   unsure    Pain Relieving Factors  changing position and ice    Multiple Pain Sites  No         [facility] PT Assessment - 08/29/19 0001      PROM   Right Shoulder Flexion  143 Degrees    Right Shoulder ABduction  140 Degrees    Right Shoulder Internal Rotation  70 Degrees    Right Shoulder External Rotation  60 Degrees      Strength   Right Shoulder Flexion  4+/5   at 90    Right Shoulder Internal Rotation  4/5    Right Shoulder External Rotation  4/5      Palpation   Palpation comment  no unexpected tenderness to palpation                    [facility] Adult PT Treatment/Exercise - 08/29/19 0001      Shoulder Exercises: Supine   Protraction  Both;10 reps   with dowel rod x 2 sets   Protraction Limitations  cane with 1lb weight     Flexion Limitations  1lb supine 2x10 no pain     Other Supine Exercises  wand flexion AAROM 2 x 10 with dowel rod   working into end range as able   Other Supine Exercises  wand ER with cuing not to push to hard just to maintain motion       Shoulder Exercises: Sidelying   Other Sidelying Exercises  ER 2x10       Shoulder Exercises: Standing   External Rotation  Strengthening;15 reps;Theraband    Theraband Level (Shoulder External Rotation)  Level 2 (Red)    Internal Rotation  Strengthening;15 reps;Theraband     Theraband Level (Shoulder Internal Rotation)  Level 2 (Red)    Extension  20 reps    Theraband Level (Shoulder Extension)  Level 3 (Green)    Row  20 reps    Theraband Level (Shoulder Row)  Level 3 (Green)    Other Standing Exercises  finger ladder x5       Manual Therapy   Joint Mobilization  grade II III GHJ AP and inferior mobs     Passive ROM  gentle ER; flexion and IR              PT Education - 08/28/19 1641    Education Details  HEP and symptom mangement    Person(s) Educated  Patient    Methods  Demonstration;Tactile cues;Explanation;Verbal cues    Comprehension  Verbalized understanding;Returned demonstration;Verbal cues required;Tactile cues required       PT Short Term Goals - 08/29/19 1337      PT SHORT TERM GOAL #1   Title  pt to be I with intial HEP    Baseline  indepdnent with basic HEP    Time  2    Period  Weeks    Status  Achieved      PT SHORT TERM GOAL #2   Title  pt to verbalize and demo techniques to contorl and reduce pain via RICE and HEP    Baseline  well controled pain    Time  2    Period  Weeks    Status  Achieved    Target Date  08/06/19      PT SHORT TERM GOAL #3   Status  On-going        PT Long Term Goals - 08/29/19 1337      PT LONG TERM GOAL #1   Title  pt to increase R shoulder AROM flexion/ abduction to >/= 145 degrees, and ER/ IR by >/= 30 degrees    Baseline  ER 60 degrees, IR 70 degrees, flexion 115, abduction 94 degrees    Time  8    Period  Weeks    Status  On-going      PT LONG TERM GOAL #2   Title  pt  to increase gross  R shoulder strength to >/= 4+/5 promote shoulder stability    Baseline  improving    Time  8    Period  Weeks    Status  On-going      PT LONG TERM GOAL #3   Title  pt to be able to return to personal exercies and her current work with no limitations or pain    Baseline  not bale to return to exercises    Time  8    Period  Weeks    Status  On-going            Plan - 08/29/19  1329    Clinical Impression Statement  Patient is making good progress. Her extenral rotation has improved. She has been able to perfrom AAROM and light strengthening without difficulty. Therapy will continue to progress per protocol    Comorbidities  hx of depression    Stability/Clinical Decision Making  Stable/Uncomplicated    Clinical Decision Making  Low    Rehab Potential  Good    PT Frequency  2x / week    PT Duration  8 weeks    PT Treatment/Interventions  ADLs/Self Care Home Management;Cryotherapy;Electrical Stimulation;Iontophoresis 4mg /ml Dexamethasone;Moist Heat;Ultrasound;Therapeutic activities;Therapeutic exercise;Patient/family education;Manual techniques;Passive range of motion;Taping;Vasopneumatic Device;Neuromuscular re-education    PT Next Visit Plan  MCD ERO/resubmission STW, Shoulder mobs  PROM > AAROM, scapular setting, modalities PRN. ( see protocol for [employer] repair will be 13 weeks post-op on 08/14/2019)    PT Home Exercise Plan  [id number] (updated HEP code )  - wand flexion/ abduction and ER, upper trap stretching, 4-way shoulder isometrics, internal / external rotation, rows, scapular protraction, rows    Consulted and Agree with Plan of Care  Patient       Patient will benefit from skilled therapeutic intervention in order to improve the following deficits and impairments:  Improper body mechanics, Decreased strength, Postural dysfunction, Pain, Impaired UE functional use, Decreased endurance, Decreased activity tolerance, Decreased range of motion, Increased edema  Visit Diagnosis: Status post arthroscopy of right shoulder  Muscle weakness (generalized)  Chronic right shoulder pain  Stiffness of right shoulder, not elsewhere classified  Localized edema     Problem List Patient Active Problem List   Diagnosis Date Noted  . 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 [place], [place], [address] Phone: [phone]   Fax:  [phone]  Name: Shylie Polo MRN: [id number] Date of Birth: 1985/11/12

## 2019-09-03 ENCOUNTER — Other Ambulatory Visit (HOSPITAL_COMMUNITY): Admission: RE | Admit: 2019-09-03 | Payer: Medicaid Other | Source: Ambulatory Visit

## 2019-09-04 ENCOUNTER — Other Ambulatory Visit: Payer: Self-pay

## 2019-09-04 ENCOUNTER — Encounter: Payer: Self-pay | Admitting: Physical Therapy

## 2019-09-04 ENCOUNTER — Ambulatory Visit: Payer: Medicaid Other | Admitting: Physical Therapy

## 2019-09-04 DIAGNOSIS — G8929 Other chronic pain: Secondary | ICD-10-CM

## 2019-09-04 DIAGNOSIS — Z9889 Other specified postprocedural states: Secondary | ICD-10-CM | POA: Diagnosis not present

## 2019-09-04 DIAGNOSIS — M25611 Stiffness of right shoulder, not elsewhere classified: Secondary | ICD-10-CM

## 2019-09-04 DIAGNOSIS — R600 Localized edema: Secondary | ICD-10-CM

## 2019-09-04 DIAGNOSIS — M25511 Pain in right shoulder: Secondary | ICD-10-CM

## 2019-09-04 DIAGNOSIS — M6281 Muscle weakness (generalized): Secondary | ICD-10-CM

## 2019-09-05 ENCOUNTER — Encounter: Payer: Self-pay | Admitting: Physical Therapy
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
## 2019-09-06 ENCOUNTER — Ambulatory Visit: Admit: 2019-09-06 | Payer: Medicaid Other | Admitting: Surgery

## 2019-09-06 ENCOUNTER — Other Ambulatory Visit (HOSPITAL_COMMUNITY): Payer: Medicaid Other

## 2019-09-06 SURGERY — THYROIDECTOMY
Anesthesia: General

## 2019-09-11 ENCOUNTER — Ambulatory Visit: Payer: Medicaid Other | Admitting: Physical Therapy

## 2019-09-25 ENCOUNTER — Other Ambulatory Visit: Payer: Self-pay

## 2019-09-25 ENCOUNTER — Encounter: Payer: Self-pay | Admitting: Physical Therapy

## 2019-09-25 ENCOUNTER — Ambulatory Visit: Payer: Medicaid Other | Attending: Orthopaedic Surgery | Admitting: Physical Therapy

## 2019-09-25 DIAGNOSIS — Z9889 Other specified postprocedural states: Secondary | ICD-10-CM | POA: Diagnosis present

## 2019-09-25 DIAGNOSIS — M6281 Muscle weakness (generalized): Secondary | ICD-10-CM | POA: Diagnosis present

## 2019-09-25 DIAGNOSIS — M25611 Stiffness of right shoulder, not elsewhere classified: Secondary | ICD-10-CM | POA: Insufficient documentation

## 2019-09-25 DIAGNOSIS — G8929 Other chronic pain: Secondary | ICD-10-CM | POA: Diagnosis present

## 2019-09-25 DIAGNOSIS — M25511 Pain in right shoulder: Secondary | ICD-10-CM | POA: Insufficient documentation

## 2019-09-25 DIAGNOSIS — R600 Localized edema: Secondary | ICD-10-CM | POA: Insufficient documentation
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
## 2020-01-11 ENCOUNTER — Encounter (HOSPITAL_COMMUNITY): Payer: Self-pay

## 2020-01-11 ENCOUNTER — Emergency Department (HOSPITAL_COMMUNITY)
Admission: EM | Admit: 2020-01-11 | Discharge: 2020-01-11 | Disposition: A | Discharge status: Home / Self Care | Payer: Medicaid Other | Attending: Emergency Medicine | Admitting: Emergency Medicine

## 2020-01-11 ENCOUNTER — Other Ambulatory Visit: Payer: Self-pay

## 2020-01-11 DIAGNOSIS — M791 Myalgia, unspecified site: Secondary | ICD-10-CM | POA: Diagnosis not present

## 2020-01-11 DIAGNOSIS — R109 Unspecified abdominal pain: Secondary | ICD-10-CM | POA: Diagnosis not present

## 2020-01-11 DIAGNOSIS — R638 Other symptoms and signs concerning food and fluid intake: Secondary | ICD-10-CM | POA: Insufficient documentation

## 2020-01-11 DIAGNOSIS — Z87891 Personal history of nicotine dependence: Secondary | ICD-10-CM | POA: Insufficient documentation

## 2020-01-11 DIAGNOSIS — R197 Diarrhea, unspecified: Secondary | ICD-10-CM | POA: Diagnosis not present

## 2020-01-11 DIAGNOSIS — R1110 Vomiting, unspecified: Secondary | ICD-10-CM | POA: Diagnosis present

## 2020-01-11 DIAGNOSIS — R519 Headache, unspecified: Secondary | ICD-10-CM | POA: Diagnosis not present

## 2020-01-11 DIAGNOSIS — R6883 Chills (without fever): Secondary | ICD-10-CM | POA: Insufficient documentation

## 2020-01-11 LAB — COMPREHENSIVE METABOLIC PANEL
ALT: 16 U/L (ref 0–44)
AST: 22 U/L (ref 15–41)
Albumin: 4.3 g/dL (ref 3.5–5.0)
Alkaline Phosphatase: 60 U/L (ref 38–126)
Anion gap: 12 (ref 5–15)
BUN: 9 mg/dL (ref 6–20)
CO2: 18 mmol/L — ABNORMAL LOW (ref 22–32)
Calcium: 9.3 mg/dL (ref 8.9–10.3)
Chloride: 104 mmol/L (ref 98–111)
Creatinine, Ser: 0.71 mg/dL (ref 0.44–1.00)
GFR calc Af Amer: >60 mL/min (ref >60)
GFR calc non Af Amer: >60 mL/min (ref >60)
Glucose, Bld: 111 mg/dL — ABNORMAL HIGH (ref 70–99)
Potassium: 3.5 mmol/L (ref 3.5–5.1)
Sodium: 134 mmol/L — ABNORMAL LOW (ref 135–145)
Total Bilirubin: 1.1 mg/dL (ref 0.3–1.2)
Total Protein: 7.3 g/dL (ref 6.5–8.1)

## 2020-01-11 LAB — URINALYSIS, ROUTINE W REFLEX MICROSCOPIC
Bilirubin Urine: NEGATIVE
Glucose, UA: NEGATIVE mg/dL
Hgb urine dipstick: NEGATIVE
Ketones, ur: 80 mg/dL — AB
Nitrite: NEGATIVE
Protein, ur: 30 mg/dL — AB
Specific Gravity, Urine: 1.031 — ABNORMAL HIGH (ref 1.005–1.030)
pH: 5 (ref 5.0–8.0)

## 2020-01-11 LAB — CBC
HCT: 44 % (ref 36.0–46.0)
Hemoglobin: 14.9 g/dL (ref 12.0–15.0)
MCH: 31.4 pg (ref 26.0–34.0)
MCHC: 33.9 g/dL (ref 30.0–36.0)
MCV: 92.8 fL (ref 80.0–100.0)
Platelets: 275 10*3/uL (ref 150–400)
RBC: 4.74 MIL/uL (ref 3.87–5.11)
RDW: 13.2 % (ref 11.5–15.5)
WBC: 15.5 10*3/uL — ABNORMAL HIGH (ref 4.0–10.5)
nRBC: 0 % (ref 0.0–0.2)

## 2020-01-11 LAB — I-STAT BETA HCG BLOOD, ED (MC, WL, AP ONLY): I-stat hCG, quantitative: <5 m[IU]/mL (ref <5)

## 2020-01-11 LAB — LIPASE, BLOOD: Lipase: 18 U/L (ref 11–51)

## 2020-01-11 MED ORDER — FAMOTIDINE 20 MG PO TABS
20.0000 mg | ORAL_TABLET | Freq: Two times a day (BID) | ORAL | 0 refills | Status: DC
Start: 2020-01-11 — End: 2021-02-09

## 2020-01-11 MED ORDER — PROMETHAZINE HCL 25 MG PO TABS
25.0000 mg | ORAL_TABLET | Freq: Four times a day (QID) | ORAL | 0 refills | Status: DC | PRN
Start: 2020-01-11 — End: 2021-02-09

## 2020-01-11 MED ORDER — KETOROLAC TROMETHAMINE 30 MG/ML IJ SOLN
15.0000 mg | Freq: Once | INTRAMUSCULAR | Status: AC
  Administered 2020-01-11: 15 mg via INTRAVENOUS
  Filled 2020-01-11: qty 1

## 2020-01-11 MED ORDER — SODIUM CHLORIDE 0.9% FLUSH
3.0000 mL | Freq: Once | INTRAVENOUS | Status: AC
  Administered 2020-01-11: 3 mL via INTRAVENOUS

## 2020-01-11 MED ORDER — METOCLOPRAMIDE HCL 5 MG/ML IJ SOLN
10.0000 mg | Freq: Once | INTRAMUSCULAR | Status: AC
  Administered 2020-01-11: 10 mg via INTRAVENOUS
  Filled 2020-01-11: qty 2

## 2020-01-11 MED ORDER — SODIUM CHLORIDE 0.9 % IV BOLUS
1000.0000 mL | Freq: Once | INTRAVENOUS | Status: AC
  Administered 2020-01-11: 1000 mL via INTRAVENOUS

## 2020-01-11 MED ORDER — DIPHENHYDRAMINE HCL 50 MG/ML IJ SOLN
25.0000 mg | Freq: Once | INTRAMUSCULAR | Status: AC
  Administered 2020-01-11: 25 mg via INTRAVENOUS
  Filled 2020-01-11: qty 1

## 2020-01-11 MED ORDER — ONDANSETRON 4 MG PO TBDP
4.0000 mg | ORAL_TABLET | Freq: Once | ORAL | Status: AC | PRN
  Administered 2020-01-11: 4 mg via ORAL
  Filled 2020-01-11: qty 1

## 2020-01-11 NOTE — ED Provider Notes (Signed)
[facility]-EMERGENCY DEPT Provider Note   CSN: [id number] Arrival date & time: 01/11/20  0155     History Chief Complaint  Patient presents with  . Emesis    Barbara Gilbert is a 34 y.o. female with a past medical history of PTSD, seizures presenting to the ED with a chief complaint of vomiting, body aches, diarrhea since yesterday.  Denies any suspicious food ingestions prior to this.  She reports several episodes of nonbloody, nonbilious emesis and loose stools.  She tried taking Zofran that she had at home but vomited immediately afterwards.  No sick contacts with similar symptoms.  Reports headache and generalized body aches as well as intermittent chills.  She has not tried any other medications or antipyretics to help with her symptoms.  She denies any vaginal complaints.  Denies any urinary symptoms reports abdominal cramping when she vomits.  She denies any back pain, chest pain, shortness of breath.  HPI     Past Medical History:  Diagnosis Date  . Anxiety attack   . Panic disorder   . 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 Seizure (HCC) 03/11/2019   seizure x 1 after fall down stair and loss consciousness; ER visit and observation overnight in hospital  . Seizure disorder [facility])    sees Dr. Patrcia Dolly    Patient Active Problem List   Diagnosis Date Noted  . 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 abortions     X 2----2012 and 2014  . BIOPSY THYROID     1 week ago October 2020; awaiting result  . LAPAROSCOPIC TUBAL LIGATION Bilateral 10/04/2017   Procedure: LAPAROSCOPIC TUBAL LIGATION WITH FILSHIE CLIPS;  Surgeon: Adam Phenix, MD;  Location: [facility];  Service: Gynecology;  Laterality: Bilateral;  . SHOULDER ARTHROSCOPY WITH BANKART REPAIR Right 06/05/2019   Procedure: SHOULDER ARTHROSCOPY WITH BANKART REPAIR;  Surgeon: Bjorn Pippin, MD;  Location: [facility];  Service: Orthopedics;  Laterality: Right;     OB History    Gravida  3   Para  1   Term  1   Preterm  0   AB  2   Living  1     SAB  0   TAB  2   Ectopic  0   Multiple      Live Births  1           Family History  Problem Relation Age of Onset  . Fibromyalgia Mother   . Mental illness Mother   . Gout Father   . Asthma Sister     Social History   Tobacco Use  . Smoking status: Former Smoker    Packs/day: 0.30    Start date: 2009    Quit date: 03/12/2019    Years since quitting: 0.8  . Smokeless tobacco: Never Used  Substance Use Topics  . Alcohol use: No  . Drug use: Yes    Frequency: 4.0 times per week    Types: Marijuana    Comment: 1 week ago    Home Medications Prior to Admission medications   Medication Sig Start Date End Date Taking? Authorizing Provider  famotidine (PEPCID) 20 MG tablet Take 1 tablet (20 mg total) by mouth 2 (two) times daily. 01/11/20   Scottlyn Mchaney, PA-C  promethazine (PHENERGAN) 25 MG tablet Take 1 tablet (25 mg total) by  mouth every 6 (six) hours as needed for nausea or vomiting. 01/11/20   Dietrich Pates, PA-C    Allergies    Patient has no known allergies.  Review of Systems   Review of Systems  Constitutional: Positive for appetite change and chills. Negative for fever.  HENT: Negative for ear pain, rhinorrhea, sneezing and sore throat.   Eyes: Negative for photophobia and visual disturbance.  Respiratory: Negative for cough, chest tightness, shortness of breath and wheezing.   Cardiovascular: Negative for chest pain and palpitations.  Gastrointestinal: Positive for diarrhea, nausea and vomiting. Negative for abdominal pain, blood in stool and constipation.  Genitourinary: Negative for dysuria, hematuria and urgency.  Musculoskeletal: Positive for myalgias.  Skin: Negative for rash.  Neurological: Positive for headaches. Negative for dizziness, weakness and light-headedness.     Physical Exam Updated Vital Signs BP 137/86   Pulse 86   Temp 98.2 F (36.8 C)   Resp 16   Ht 5\' 3"  (1.6 m)   Wt 58.1 kg   SpO2 99%   BMI 22.67 kg/m   Physical Exam Vitals and nursing note reviewed.  Constitutional:      General: She is not in acute distress.    Appearance: She is well-developed.  HENT:     Head: Normocephalic and atraumatic.     Nose: Nose normal.  Eyes:     General: No scleral icterus.       Right eye: No discharge.        Left eye: No discharge.     Conjunctiva/sclera: Conjunctivae normal.  Cardiovascular:     Rate and Rhythm: Normal rate and regular rhythm.     Heart sounds: Normal heart sounds. No murmur. No friction rub. No gallop.   Pulmonary:     Effort: Pulmonary effort is normal. No respiratory distress.     Breath sounds: Normal breath sounds.  Abdominal:     General: Bowel sounds are normal. There is no distension.     Palpations: Abdomen is soft.     Tenderness: There is no abdominal tenderness. There is no guarding.     Comments: Soft, nontender nondistended.  Musculoskeletal:        General: Normal range of motion.     Cervical back: Normal range of motion and neck supple.  Skin:    General: Skin is warm and dry.     Findings: No rash.  Neurological:     Mental Status: She is alert and oriented to person, place, and time.     Cranial Nerves: No cranial nerve deficit.     Motor: No weakness or abnormal muscle tone.     Coordination: Coordination normal.     ED Results / Procedures / Treatments   Labs (all labs ordered are listed, but only abnormal results are displayed) Labs Reviewed  COMPREHENSIVE METABOLIC PANEL - Abnormal; Notable for the following components:      Result Value   Sodium 134 (*)    CO2 18 (*)    Glucose, Bld 111 (*)    All other components within normal limits  CBC - Abnormal; Notable for the following components:   WBC 15.5 (*)    All other components within normal limits  URINALYSIS, ROUTINE W  REFLEX MICROSCOPIC - Abnormal; Notable for the following components:   Color, Urine AMBER (*)    APPearance CLOUDY (*)    Specific Gravity, Urine 1.031 (*)    Ketones, ur 80 (*)    Protein, ur 30 (*)  Leukocytes,Ua TRACE (*)    Bacteria, UA FEW (*)    All other components within normal limits  LIPASE, BLOOD  I-STAT BETA HCG BLOOD, ED ([facility], [facility], [facility] ONLY)    EKG None  Radiology No results found.  Procedures Procedures (including critical care time)  Medications Ordered in ED Medications  sodium chloride flush (NS) 0.9 % injection 3 mL (3 mLs Intravenous Given 01/11/20 0456)  ondansetron (ZOFRAN-ODT) disintegrating tablet 4 mg (4 mg Oral Given 01/11/20 0222)  sodium chloride 0.9 % bolus 1,000 mL (1,000 mLs Intravenous New Bag/Given 01/11/20 0435)  metoCLOPramide (REGLAN) injection 10 mg (10 mg Intravenous Given 01/11/20 0435)  diphenhydrAMINE (BENADRYL) injection 25 mg (25 mg Intravenous Given 01/11/20 0435)  ketorolac (TORADOL) 30 MG/ML injection 15 mg (15 mg Intravenous Given 01/11/20 0456)    ED Course  I have reviewed the triage vital signs and the nursing notes.  Pertinent labs & imaging results that were available during my care of the patient were reviewed by me and considered in my medical decision making (see chart for details).    MDM Rules/Calculators/A&P                      34 year old female with a past medical history of PTSD, seizures presenting to the ED with a chief complaint of vomiting, body aches and diarrhea since yesterday.  She tried taking her home antiemetics without any improvement in her symptoms.  She denies sick contacts with similar symptoms.  Reports headache and generalized body aches as well as intermittent chills.  On exam abdomen is soft, nontender nondistended.  She was given antiemetics at triage but continues to dry heave.  She is afebrile without recent use of antipyretics.  Lab work here significant for leukocytosis of 15 which I feel could be  reactive to her symptoms today.  CMP, lipase unremarkable.  Urinalysis with ketones, some leukocytes and bacteria but patient asymptomatic.  hCG is negative.  Patient was given IV fluids, Toradol, migraine cocktail with significant improvement in her symptoms.  No neurological abnormalities noted on exam.  Repeat abdominal exams remain benign.  Suspect that her symptoms are viral in nature.  She was declining Covid test.  We will have her continue symptomatic treatment at home, encouraged to increase fluid intake and follow-up with PCP.  At this time I have low suspicion for cholecystitis, appendicitis, diverticulitis or other emergent cause of her symptoms.  All imaging, if done today, including plain films, CT scans, and ultrasounds, independently reviewed by me, and interpretations confirmed via formal radiology reads.  Patient is hemodynamically stable, in NAD, and able to ambulate in the ED. Evaluation does not show pathology that would require ongoing emergent intervention or inpatient treatment. I explained the diagnosis to the patient. Pain has been managed and has no complaints prior to discharge. Patient is comfortable with above plan and is stable for discharge at this time. All questions were answered prior to disposition. Strict return precautions for returning to the ED were discussed. Encouraged follow up with PCP.   An After Visit Summary was printed and given to the patient.   Portions of this note were generated with [employer]. Dictation errors may occur despite best attempts at proofreading.  Final Clinical Impression(s) / ED Diagnoses Final diagnoses:  Nausea vomiting and diarrhea    Rx / DC Orders ED Discharge Orders         Ordered    promethazine (PHENERGAN) 25 MG tablet  Every 6 hours PRN     01/11/20 0529    famotidine (PEPCID) 20 MG tablet  2 times daily     01/11/20 0529           Dietrich Pates, PA-C 01/11/20 0533    Ward, Layla Maw,  DO 01/11/20 [id number]

## 2020-01-11 NOTE — ED Triage Notes (Signed)
Pt reports vomiting that started Thursday night. Reports body aches. Tried to take zofran but couldn't keep it down. Actively vomiting in triage. Also endorses headache.

## 2020-01-11 NOTE — Discharge Instructions (Signed)
Take the medications to help with your symptoms. Make sure you are drinking plenty of fluids. Follow-up with your primary care provider. Return to the ER if you start to experience worsening abdominal pain, fever, uncontrollable vomiting.

## 2020-02-05 ENCOUNTER — Encounter: Payer: Self-pay | Admitting: Obstetrics

## 2020-02-05 ENCOUNTER — Ambulatory Visit: Payer: Medicaid Other | Admitting: Obstetrics

## 2020-02-05 ENCOUNTER — Other Ambulatory Visit (HOSPITAL_COMMUNITY)
Admission: RE | Admit: 2020-02-05 | Discharge: 2020-02-05 | Disposition: A | Discharge status: Home / Self Care | Payer: Medicaid Other | Source: Ambulatory Visit | Attending: Obstetrics | Admitting: Obstetrics

## 2020-02-05 ENCOUNTER — Other Ambulatory Visit: Payer: Self-pay

## 2020-02-05 VITALS — BP 109/87 | HR 65 | Ht 63.0 in | Wt 129.3 lb

## 2020-02-05 DIAGNOSIS — N898 Other specified noninflammatory disorders of vagina: Secondary | ICD-10-CM | POA: Diagnosis present

## 2020-02-05 DIAGNOSIS — Z9851 Tubal ligation status: Secondary | ICD-10-CM | POA: Diagnosis not present

## 2020-02-05 DIAGNOSIS — Z0000 Encounter for general adult medical examination without abnormal findings: Secondary | ICD-10-CM

## 2020-02-05 DIAGNOSIS — Z01419 Encounter for gynecological examination (general) (routine) without abnormal findings: Secondary | ICD-10-CM | POA: Insufficient documentation

## 2020-02-05 NOTE — Progress Notes (Signed)
Pt is here for annual gyn exam. Last pap 07/09/18, normal.

## 2020-02-05 NOTE — Progress Notes (Signed)
Subjective:        Barbara Gilbert is a 34 y.o. female here for a routine exam.  Current complaints: Vulva itching.    Personal health questionnaire:  Is patient [name], have a family history of breast and/or ovarian cancer: no Is there a family history of uterine cancer diagnosed at age < 36, gastrointestinal cancer, urinary tract cancer, family member who is a Personnel officer syndrome-associated carrier: no Is the patient overweight and hypertensive, family history of diabetes, personal history of gestational diabetes, preeclampsia or PCOS: no Is patient over 43, have PCOS,  family history of premature CHD under age 5, diabetes, smoke, have hypertension or peripheral artery disease:  no At any time, has a partner hit, kicked or otherwise hurt or frightened you?: no Over the past 2 weeks, have you felt down, depressed or hopeless?: no Over the past 2 weeks, have you felt little interest or pleasure in doing things?:no   Gynecologic History Patient's last menstrual period was 01/19/2020 (exact date). Contraception: tubal ligation Last Pap: 07-09-2018. Results were: normal Last mammogram: n/a. Results were: n/a  Obstetric History OB History  Gravida Para Term Preterm AB Living  3 1 1  0 2 1  SAB TAB Ectopic Multiple Live Births  0 2 0   1    # Outcome Date GA Lbr Len/2nd Weight Sex Delivery Anes PTL Lv  3 Term 01/21/07    M Vag-Spont   LIV  2 TAB           1 TAB             Past Medical History:  Diagnosis Date  . Anxiety attack   . Panic disorder   . 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 Seizure (HCC) 03/11/2019   seizure x 1 after fall down stair and loss consciousness; ER visit and observation overnight in hospital  . Seizure disorder [facility])    [name]    Past Surgical History:  Procedure Laterality Date  . abortions     X 2----2012 and 2014  . BIOPSY THYROID     1 week ago October 2020; awaiting result  .  LAPAROSCOPIC TUBAL LIGATION Bilateral 10/04/2017   Procedure: LAPAROSCOPIC TUBAL LIGATION WITH FILSHIE CLIPS;  Surgeon: 10/06/2017, MD;  Location: [facility];  Service: Gynecology;  Laterality: Bilateral;  . SHOULDER ARTHROSCOPY WITH BANKART REPAIR Right 06/05/2019   Procedure: SHOULDER ARTHROSCOPY WITH BANKART REPAIR;  Surgeon: 06/07/2019, MD;  Location: [facility];  Service: Orthopedics;  Laterality: Right;     Current Outpatient Medications:  .  famotidine (PEPCID) 20 MG tablet, Take 1 tablet (20 mg total) by mouth 2 (two) times daily., Disp: 30 tablet, Rfl: 0 .  promethazine (PHENERGAN) 25 MG tablet, Take 1 tablet (25 mg total) by mouth every 6 (six) hours as needed for nausea or vomiting., Disp: 8 tablet, Rfl: 0 No Known Allergies  Social History   Tobacco Use  . Smoking status: Former Smoker    Packs/day: 0.30    Start date: 2009    Quit date: 03/12/2019    Years since quitting: 0.9  . Smokeless tobacco: Never Used  Substance Use Topics  . Alcohol use: No    Family History  Problem Relation Age of Onset  . Fibromyalgia Mother   . Mental illness Mother   . Gout Father   . Asthma Sister  Review of Systems  Constitutional: negative for fatigue and weight loss Respiratory: negative for cough and wheezing Cardiovascular: negative for chest pain, fatigue and palpitations Gastrointestinal: negative for abdominal pain and change in bowel habits Musculoskeletal:negative for myalgias Neurological: negative for gait problems and tremors Behavioral/Psych: negative for abusive relationship, depression Endocrine: negative for temperature intolerance    Genitourinary:negative for abnormal menstrual periods, genital lesions, hot flashes, sexual problems and vaginal discharge Integument/breast: negative for breast lump, breast tenderness, nipple discharge and skin lesion(s)    Objective:       BP 109/87   Pulse 65   Ht 5\' 3"  (1.6 m)   Wt 129 lb 4.8 oz  (58.7 kg)   LMP 01/19/2020 (Exact Date)   BMI 22.90 kg/m  General:   alert  Skin:   no rash or abnormalities  Lungs:   clear to auscultation bilaterally  Heart:   regular rate and rhythm, S1, S2 normal, no murmur, click, rub or gallop  Breasts:   normal without suspicious masses, skin or nipple changes or axillary nodes  Abdomen:  normal findings: no organomegaly, soft, non-tender and no hernia  Pelvis:  External genitalia: normal general appearance Urinary system: urethral meatus normal and bladder without fullness, nontender Vaginal: normal without tenderness, induration or masses Cervix: normal appearance Adnexa: normal bimanual exam Uterus: anteverted and non-tender, normal size   Lab Review Urine pregnancy test Labs reviewed yes Radiologic studies reviewed no  50% of 20 min visit spent on counseling and coordination of care.   Assessment:     1. Encounter for routine gynecological examination with Papanicolaou smear of cervix Rx: - Cytology - PAP( [place])  2. Vaginal discharge Rx - Cervicovaginal ancillary only( [place])    Plan:    Education reviewed: calcium supplements, depression evaluation, low fat, low cholesterol diet, safe sex/STD prevention, self breast exams and weight bearing exercise. Follow up in: 1 year.    01/21/2020, MD 02/05/2020 9:18 AM

## 2020-02-06 LAB — CERVICOVAGINAL ANCILLARY ONLY
Bacterial Vaginitis (gardnerella): NEGATIVE
Candida Glabrata: NEGATIVE
Candida Vaginitis: POSITIVE — AB
Chlamydia: NEGATIVE
Comment: NEGATIVE
Comment: NEGATIVE
Comment: NEGATIVE
Comment: NEGATIVE
Comment: NEGATIVE
Comment: NORMAL
Neisseria Gonorrhea: NEGATIVE
Trichomonas: POSITIVE — AB

## 2020-02-06 LAB — CYTOLOGY - PAP
Comment: NEGATIVE
Diagnosis: NEGATIVE
High risk HPV: NEGATIVE

## 2020-02-07 ENCOUNTER — Other Ambulatory Visit: Payer: Self-pay | Admitting: Obstetrics

## 2020-02-07 DIAGNOSIS — A5901 Trichomonal vulvovaginitis: Secondary | ICD-10-CM

## 2020-02-07 DIAGNOSIS — B3731 Acute candidiasis of vulva and vagina: Secondary | ICD-10-CM

## 2020-02-07 MED ORDER — METRONIDAZOLE 500 MG PO TABS: 500.0000 mg | ORAL_TABLET | Freq: Two times a day (BID) | ORAL | 2 refills | Status: DC

## 2020-02-07 MED ORDER — FLUCONAZOLE 150 MG PO TABS: 150.0000 mg | ORAL_TABLET | Freq: Once | ORAL | 0 refills | Status: AC

## 2020-04-21 ENCOUNTER — Other Ambulatory Visit: Payer: Self-pay | Admitting: Surgery

## 2020-04-21 DIAGNOSIS — E041 Nontoxic single thyroid nodule: Secondary | ICD-10-CM

## 2020-04-29 ENCOUNTER — Ambulatory Visit
Admission: RE | Admit: 2020-04-29 | Discharge: 2020-04-29 | Disposition: A | Discharge status: Home / Self Care | Payer: Medicaid Other | Source: Ambulatory Visit | Attending: Surgery | Admitting: Surgery

## 2020-04-29 DIAGNOSIS — E041 Nontoxic single thyroid nodule: Secondary | ICD-10-CM

## 2021-01-18 ENCOUNTER — Other Ambulatory Visit: Payer: Self-pay

## 2021-01-18 ENCOUNTER — Encounter (HOSPITAL_COMMUNITY): Payer: Self-pay

## 2021-01-18 ENCOUNTER — Emergency Department (HOSPITAL_COMMUNITY)
Admission: EM | Admit: 2021-01-18 | Discharge: 2021-01-18 | Disposition: A | Discharge status: Home / Self Care | Payer: Medicaid Other | Attending: Emergency Medicine | Admitting: Emergency Medicine

## 2021-01-18 DIAGNOSIS — F17210 Nicotine dependence, cigarettes, uncomplicated: Secondary | ICD-10-CM | POA: Insufficient documentation

## 2021-01-18 DIAGNOSIS — M79652 Pain in left thigh: Secondary | ICD-10-CM | POA: Insufficient documentation

## 2021-01-18 DIAGNOSIS — H5712 Ocular pain, left eye: Secondary | ICD-10-CM | POA: Insufficient documentation

## 2021-01-18 MED ORDER — FLUORESCEIN SODIUM 1 MG OP STRP
1.0000 | ORAL_STRIP | Freq: Once | OPHTHALMIC | Status: AC
  Administered 2021-01-18: 1 via OPHTHALMIC
  Filled 2021-01-18: qty 1

## 2021-01-18 MED ORDER — ERYTHROMYCIN 5 MG/GM OP OINT
TOPICAL_OINTMENT | Freq: Once | OPHTHALMIC | Status: AC
  Administered 2021-01-18: 1 via OPHTHALMIC
  Filled 2021-01-18: qty 3.5

## 2021-01-18 MED ORDER — TETRACAINE HCL 0.5 % OP SOLN
2.0000 [drp] | Freq: Once | OPHTHALMIC | Status: AC
  Administered 2021-01-18: 2 [drp] via OPHTHALMIC
  Filled 2021-01-18: qty 4

## 2021-01-18 NOTE — ED Triage Notes (Signed)
Pt states that when she woke up this afternoon around 1400 due to pain in her left eye. Pt states that she does not wear contacts and she does not think she scratched it in her sleep. Pt states she flushed it out a few times which gave no relief. Pt states that her vision is blurry on the left side. Pt is able to open her left eye.

## 2021-01-18 NOTE — ED Provider Notes (Signed)
Barbara Gilbert Provider Note   CSN: [id number] Arrival date & time: 01/18/21  1642     History Chief Complaint  Patient presents with   Eye Pain    Barbara Gilbert is a 35 y.o. female.  HPI  35 year old female with a history of anxiety attack, panic disorder, PTSD, seizures, who presents to the emergency department today complaining of left thigh pain.  States she woke up this morning with a foreign body sensation and eye irritation to the left eye.  She has some decreased vision in the left eye.  She denies any recent URI symptoms, trauma or any other associated symptoms.  She does not wear contacts or glasses.  Past Medical History:  Diagnosis Date   Anxiety attack    Panic disorder    PTSD (post-traumatic stress disorder)    Held hostage while pregnant as a teen   Seizure (HCC) 03/11/2019   seizure x 1 after fall down stair and loss consciousness; ER visit and observation overnight in hospital   Seizure disorder [facility])    sees Dr. 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    Past Surgical History:  Procedure Laterality Date   abortions     X 2----2012 and 2014   BIOPSY THYROID     1 week ago October 2020; awaiting result   LAPAROSCOPIC TUBAL LIGATION Bilateral 10/04/2017   Procedure: LAPAROSCOPIC TUBAL LIGATION WITH FILSHIE CLIPS;  Surgeon: Adam Phenix, MD;  Location: [facility];  Service: Gynecology;  Laterality: Bilateral;   SHOULDER ARTHROSCOPY WITH BANKART REPAIR Right 06/05/2019   Procedure: SHOULDER ARTHROSCOPY WITH BANKART REPAIR;  Surgeon: Bjorn Pippin, MD;  Location[facility];  Service: Orthopedics;  Laterality: Right;     OB History     Gravida  3   Para  1   Term  1   Preterm  0   AB  2   Living  1      SAB  0   IAB  2   Ectopic  0   Multiple      Live Births  1           Family History  Problem Relation Age of  Onset   Fibromyalgia Mother    Mental illness Mother    Gout Father    Asthma Sister     Social History   Tobacco Use   Smoking status: Every Day    Packs/day: 0.40    Pack years: 0.00    Types: Cigarettes    Start date: 2009    Last attempt to quit: 03/12/2019    Years since quitting: 1.8   Smokeless tobacco: Never  Vaping Use   Vaping Use: Never used  Substance Use Topics   Alcohol use: No   Drug use: Yes    Frequency: 4.0 times per week    Types: Marijuana    Comment: 1 week ago    Home Medications Prior to Admission medications   Medication Sig Start Date End Date Taking? Authorizing Provider  famotidine (PEPCID) 20 MG tablet Take 1 tablet (20 mg total) by mouth 2 (two) times daily. 01/11/20   Khatri, Hina, PA-C  metroNIDAZOLE (FLAGYL) 500 MG tablet Take 1 tablet (500 mg total) by mouth 2 (two) times daily. 02/07/20   Brock Bad, MD  promethazine (PHENERGAN) 25 MG tablet Take 1 tablet (25 mg total)  by mouth every 6 (six) hours as needed for nausea or vomiting. 01/11/20   Dietrich Pates, PA-C    Allergies    Patient has no known allergies.  Review of Systems   Review of Systems  Constitutional:  Negative for fever.  HENT:  Negative for rhinorrhea.   Eyes:  Positive for photophobia, pain and visual disturbance. Negative for discharge, redness and itching.  Neurological:  Negative for dizziness, weakness, light-headedness, numbness and headaches.   Physical Exam Updated Vital Signs BP 110/82   Pulse 74   Temp 98.9 F (37.2 C) (Oral)   Resp 16   Ht 5\' 3"  (1.6 m)   Wt 52.6 kg   LMP 01/15/2021   SpO2 100%   BMI 20.55 kg/m   Physical Exam Vitals and nursing note reviewed.  Constitutional:      General: She is not in acute distress.    Appearance: She is well-developed.  HENT:     Head: Normocephalic and atraumatic.  Eyes:     Extraocular Movements: Extraocular movements intact.     Conjunctiva/sclera: Conjunctivae normal.     Pupils: Pupils are equal,  round, and reactive to light.     Comments: Fluorescein stain completed and there was no significant evidence of uptake.  Lid was everted and there was no foreign body noted.  There was some photophobia but there was no consensual photophobia.  IOP's were normal bilaterally, 15 mmHg on the right and 12 mmHg on the left.  Visual acuity slightly diminished on the left  Cardiovascular:     Rate and Rhythm: Normal rate.  Pulmonary:     Effort: Pulmonary effort is normal.  Musculoskeletal:        General: Normal range of motion.     Cervical back: Neck supple.  Skin:    General: Skin is warm and dry.  Neurological:     Mental Status: She is alert.     Comments: No facial droop or ptosis.      ED Results / Procedures / Treatments   Labs (all labs ordered are listed, but only abnormal results are displayed) Labs Reviewed - No data to display  EKG None  Radiology No results found.  Procedures Procedures   Medications Ordered in ED Medications  fluorescein ophthalmic strip 1 strip (has no administration in time range)  tetracaine (PONTOCAINE) 0.5 % ophthalmic solution 2 drop (has no administration in time range)  erythromycin ophthalmic ointment (has no administration in time range)    ED Course  I have reviewed the triage vital signs and the nursing notes.  Pertinent labs & imaging results that were available during my care of the patient were reviewed by me and considered in my medical decision making (see chart for details).    MDM Rules/Calculators/A&P                          Corneal abrasion  Patient with left eye irritation and foreign body sensation.  There was no obvious corneal abrasion noted on fluorescein stain but given her symptoms, clinically I suspect this is what she has and it may just be too small for me to visualize.  She has mild change in vision.  Pt is not a contact lens wearer.  Exam non-concerning for orbital cellulitis, hyphema, corneal ulcers.  Patient will be discharged home with erythromycin.   Patient understands to follow up with ophthalmology, & to return to ER if new symptoms develop including  change in vision, purulent drainage, or entrapment.   Final Clinical Impression(s) / ED Diagnoses Final diagnoses:  Left eye pain    Rx / DC Orders ED Discharge Orders     None        Rayne Du 01/18/21 2030    Pollyann Savoy, MD 01/18/21 2349

## 2021-01-18 NOTE — Discharge Instructions (Signed)
Use the erythromycin ointment every 6 hours in the left eye for the next 7 days.  You were given information of follow-up with an eye doctor.  Please call the office tomorrow to schedule an appointment for follow-up.  Please return to the emergency department for any new or worsening symptoms in the meantime.

## 2021-01-20 ENCOUNTER — Emergency Department (HOSPITAL_COMMUNITY)
Admission: EM | Admit: 2021-01-20 | Discharge: 2021-01-20 | Disposition: A | Discharge status: Home / Self Care | Payer: Medicaid Other | Attending: Emergency Medicine | Admitting: Emergency Medicine

## 2021-01-20 ENCOUNTER — Encounter (HOSPITAL_COMMUNITY): Payer: Self-pay

## 2021-01-20 ENCOUNTER — Other Ambulatory Visit: Payer: Self-pay

## 2021-01-20 DIAGNOSIS — R112 Nausea with vomiting, unspecified: Secondary | ICD-10-CM

## 2021-01-20 DIAGNOSIS — R197 Diarrhea, unspecified: Secondary | ICD-10-CM | POA: Insufficient documentation

## 2021-01-20 DIAGNOSIS — F17210 Nicotine dependence, cigarettes, uncomplicated: Secondary | ICD-10-CM | POA: Diagnosis not present

## 2021-01-20 LAB — COMPREHENSIVE METABOLIC PANEL
ALT: 17 U/L (ref 0–44)
AST: 21 U/L (ref 15–41)
Albumin: 4.7 g/dL (ref 3.5–5.0)
Alkaline Phosphatase: 65 U/L (ref 38–126)
Anion gap: 10 (ref 5–15)
BUN: 10 mg/dL (ref 6–20)
CO2: 22 mmol/L (ref 22–32)
Calcium: 9.2 mg/dL (ref 8.9–10.3)
Chloride: 102 mmol/L (ref 98–111)
Creatinine, Ser: 0.84 mg/dL (ref 0.44–1.00)
GFR, Estimated: >60 mL/min (ref >60)
Glucose, Bld: 95 mg/dL (ref 70–99)
Potassium: 3.8 mmol/L (ref 3.5–5.1)
Sodium: 134 mmol/L — ABNORMAL LOW (ref 135–145)
Total Bilirubin: 0.8 mg/dL (ref 0.3–1.2)
Total Protein: 8 g/dL (ref 6.5–8.1)

## 2021-01-20 LAB — CBC
HCT: 45.3 % (ref 36.0–46.0)
Hemoglobin: 15.3 g/dL — ABNORMAL HIGH (ref 12.0–15.0)
MCH: 31.8 pg (ref 26.0–34.0)
MCHC: 33.8 g/dL (ref 30.0–36.0)
MCV: 94.2 fL (ref 80.0–100.0)
Platelets: 268 10*3/uL (ref 150–400)
RBC: 4.81 MIL/uL (ref 3.87–5.11)
RDW: 13.1 % (ref 11.5–15.5)
WBC: 11.1 10*3/uL — ABNORMAL HIGH (ref 4.0–10.5)
nRBC: 0 % (ref 0.0–0.2)

## 2021-01-20 LAB — LIPASE, BLOOD: Lipase: 20 U/L (ref 11–51)

## 2021-01-20 LAB — HCG, QUANTITATIVE, PREGNANCY: hCG, Beta Chain, Quant, S: <1 m[IU]/mL (ref <5)

## 2021-01-20 MED ORDER — ONDANSETRON HCL 4 MG/2ML IJ SOLN
4.0000 mg | Freq: Once | INTRAMUSCULAR | Status: AC
  Administered 2021-01-20: 4 mg via INTRAVENOUS
  Filled 2021-01-20: qty 2

## 2021-01-20 MED ORDER — ONDANSETRON 4 MG PO TBDP
4.0000 mg | ORAL_TABLET | Freq: Three times a day (TID) | ORAL | 0 refills | Status: DC | PRN
Start: 2021-01-20 — End: 2021-02-09

## 2021-01-20 MED ORDER — ONDANSETRON 4 MG PO TBDP
4.0000 mg | ORAL_TABLET | Freq: Once | ORAL | Status: AC | PRN
  Administered 2021-01-20: 4 mg via ORAL
  Filled 2021-01-20: qty 1

## 2021-01-20 MED ORDER — SODIUM CHLORIDE 0.9 % IV BOLUS
1000.0000 mL | Freq: Once | INTRAVENOUS | Status: AC
  Administered 2021-01-20: 1000 mL via INTRAVENOUS

## 2021-01-20 NOTE — ED Triage Notes (Signed)
Pt states she believes she is having a reaction to the erythromycin eye ointment she was prescribed on Monday. Reports n/v/d starting Monday night. Denies abd pain

## 2021-01-20 NOTE — Discharge Instructions (Addendum)
Work up in the emergency department was reassuring. I suspect your symptoms are due to a viral infection or food poisoning. Symptoms of uncomplicated viral gastrointestinal infections usually last 24-48 hours, and slowly improve after 2-3 days.    Treatment includes symptoms control, oral hydration, prevention of spread of illness.   Ondansetron (zofran) for nausea every 6 hours for the next 24 hours to avoid vomiting and tolerate fluids.  Acetaminophen (tylenol) for body aches, abdominal pain. Ibuprofen (advil, aleve, motrin) can be irritating to stomach, avoid these until you feel better.  Stay well hydrated with 1-2 L of water daily.  Start with bland, mild diet avoiding hot, spicy, greasy foods until you start to feel better and then can transition to normal diet. Avoid fruit or fruit juices as these can worsen diarrhea.  Norovirus and other stomach bugs are killed by alcohol and standard cleaning agents. Wash hands often and do not share utensils or drinks to avoid spread to family members.  Return for worsening abdominal pain, localized right upper or right lower abdominal pain, chest pain, shortness of breath, inability to tolerating fluids by mouth despite nausea medication, localized abdominal pain, blood in vomit or stool, blood in your urine, burning with urination, fever

## 2021-01-20 NOTE — ED Provider Notes (Signed)
[facility]-EMERGENCY DEPT Provider Note   CSN: [id number] Arrival date & time: 01/20/21  0038     History Chief Complaint  Patient presents with   Diarrhea   Emesis    Barbara Gilbert is a 35 y.o. female presents to the ED for concern of medication side effect.  States on 6/13 she came to the ED for left eye discomfort and was prescribed erythromycin ointment for a corneal scratch.  She used it 1 time when she left the emergency department.  She went straight to work and started having sudden onset nausea, vomiting, diarrhea.  She started googling erythromycin and noticed that the symptoms can be a side effect.  States she only used the ointment once in the left eye and has stopped using it since.  Since arrival to the ED 6 hours ago she has had 2 episodes of vomiting and 6-8 episodes of diarrhea.  She feels weak and dehydrated.  Has only drank 1 bottled water in the last 24 hours due to the nausea and vomiting.  Has not eaten anything.  Denies associated abdominal pain, hematemesis, obvious blood in her stool, melena, fevers.  No cough, chest pain.  No dysuria, hematuria but states she can't remember if she urinated in the last 24 hours. States she is shallow breathing when she is actively vomiting.  No sick contacts.  Denies recent intake of any new foods, old or undercooked food.  No travel.  No recent antibiotics.  No known GI problems.  States her eye is now back to normal.   HPI     Past Medical History:  Diagnosis Date   Anxiety attack    Panic disorder    PTSD (post-traumatic stress disorder)    Held hostage while pregnant as a teen   Seizure (HCC) 03/11/2019   seizure x 1 after fall down stair and loss consciousness; ER visit and observation overnight in hospital   Seizure disorder [facility])    sees Dr. 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    Past Surgical History:  Procedure Laterality  Date   abortions     X 2----2012 and 2014   BIOPSY THYROID     1 week ago October 2020; awaiting result   LAPAROSCOPIC TUBAL LIGATION Bilateral 10/04/2017   Procedure: LAPAROSCOPIC TUBAL LIGATION WITH FILSHIE CLIPS;  Surgeon: Adam Phenix, MD;  Location: [facility];  Service: Gynecology;  Laterality: Bilateral;   SHOULDER ARTHROSCOPY WITH BANKART REPAIR Right 06/05/2019   Procedure: SHOULDER ARTHROSCOPY WITH BANKART REPAIR;  Surgeon: Bjorn Pippin, MD;  Location: [facility];  Service: Orthopedics;  Laterality: Right;     OB History     Gravida  3   Para  1   Term  1   Preterm  0   AB  2   Living  1      SAB  0   IAB  2   Ectopic  0   Multiple      Live Births  1           Family History  Problem Relation Age of Onset   Fibromyalgia Mother    Mental illness Mother    Gout Father    Asthma Sister     Social History   Tobacco Use   Smoking status: Every Day    Packs/day: 0.40    Pack years: 0.00  Types: Cigarettes    Start date: 2009    Last attempt to quit: 03/12/2019    Years since quitting: 1.8   Smokeless tobacco: Never  Vaping Use   Vaping Use: Never used  Substance Use Topics   Alcohol use: No   Drug use: Yes    Frequency: 4.0 times per week    Types: Marijuana    Comment: 1 week ago    Home Medications Prior to Admission medications   Medication Sig Start Date End Date Taking? Authorizing Provider  ondansetron (ZOFRAN ODT) 4 MG disintegrating tablet Take 1 tablet (4 mg total) by mouth every 8 (eight) hours as needed for nausea or vomiting. 01/20/21  Yes Liberty Handy, PA-C  famotidine (PEPCID) 20 MG tablet Take 1 tablet (20 mg total) by mouth 2 (two) times daily. 01/11/20   Khatri, Hina, PA-C  metroNIDAZOLE (FLAGYL) 500 MG tablet Take 1 tablet (500 mg total) by mouth 2 (two) times daily. 02/07/20   Brock Bad, MD  promethazine (PHENERGAN) 25 MG tablet Take 1 tablet (25 mg total) by mouth every 6 (six) hours as needed  for nausea or vomiting. 01/11/20   Dietrich Pates, PA-C    Allergies    Patient has no known allergies.  Review of Systems   Review of Systems  Constitutional:  Positive for fatigue.  Gastrointestinal:  Positive for abdominal pain, diarrhea, nausea and vomiting.  Neurological:  Positive for weakness (generalized).  All other systems reviewed and are negative.  Physical Exam Updated Vital Signs BP 113/71   Pulse 74   Temp 99.4 F (37.4 C) (Oral)   Resp 16   Ht 5\' 3"  (1.6 m)   Wt 51.3 kg   LMP 01/15/2021   SpO2 99%   BMI 20.02 kg/m   Physical Exam Vitals and nursing note reviewed.  Constitutional:      Appearance: She is well-developed.     Comments: Non toxic in NAD  HENT:     Head: Normocephalic and atraumatic.     Nose: Nose normal.  Eyes:     Conjunctiva/sclera: Conjunctivae normal.  Cardiovascular:     Rate and Rhythm: Normal rate and regular rhythm.  Pulmonary:     Effort: Pulmonary effort is normal.     Breath sounds: Normal breath sounds.  Abdominal:     General: Bowel sounds are normal.     Palpations: Abdomen is soft.     Tenderness: There is no abdominal tenderness.     Comments: No G/R/R. No suprapubic or CVA tenderness. Negative Murphy's and McBurney's. Active BS to lower quadrants.   Musculoskeletal:        General: Normal range of motion.     Cervical back: Normal range of motion.  Skin:    General: Skin is warm and dry.     Capillary Refill: Capillary refill takes less than 2 seconds.  Neurological:     Mental Status: She is alert.  Psychiatric:        Behavior: Behavior normal.    ED Results / Procedures / Treatments   Labs (all labs ordered are listed, but only abnormal results are displayed) Labs Reviewed  COMPREHENSIVE METABOLIC PANEL - Abnormal; Notable for the following components:      Result Value   Sodium 134 (*)    All other components within normal limits  CBC - Abnormal; Notable for the following components:   WBC 11.1 (*)     Hemoglobin 15.3 (*)    All other  components within normal limits  GASTROINTESTINAL PANEL BY PCR, STOOL (REPLACES STOOL CULTURE)  LIPASE, BLOOD  HCG, QUANTITATIVE, PREGNANCY  URINALYSIS, ROUTINE W REFLEX MICROSCOPIC    EKG None  Radiology No results found.  Procedures Procedures   Medications Ordered in ED Medications  ondansetron (ZOFRAN-ODT) disintegrating tablet 4 mg (4 mg Oral Given 01/20/21 0150)  sodium chloride 0.9 % bolus 1,000 mL (1,000 mLs Intravenous New Bag/Given 01/20/21 0744)  ondansetron (ZOFRAN) injection 4 mg (4 mg Intravenous Given 01/20/21 0743)    ED Course  I have reviewed the triage vital signs and the nursing notes.  Pertinent labs & imaging results that were available during my care of the patient were reviewed by me and considered in my medical decision making (see chart for details).    MDM Rules/Calculators/A&P                          35 year old female presents to the ED for sudden onset nausea, vomiting, diarrhea.  She is concerned about side effect/allergy to erythromycin ophthalmic ointment  EMR, triage nurse notes reviewed.  Chart review reveals patient was seen in the ED for questionable corneal abrasion and prescribed erythromycin ointment on 6/13  Labs ordered, personally reviewed and interpreted by me  Labs reveal-very reassuring.  WBC 11.1.  Electrolytes, creatinine, LFTs normal.  Pregnancy test is negative.  Patient denies any urinary symptoms has no suprapubic or CVA tenderness and I do not think we need an urinalysis today.  She will attempt to provide stool for stool testing.  Medicines ordered in the ED-IV fluids and Zofran  ED course and MDM 0925: Patient has been reevaluated.  Reports feeling better.  No vomiting or episodes of diarrhea so far.  Tolerating p.o. challenge.  Suspect viral process.  Doubt side effect from ophthalmic ointment, explained to patient. She has no abdominal tenderness, fever, leukocytosis, urinary symptoms.   Will discharge with Zofran, diet modifications.  Return precautions discussed.  Patient is comfortable this plan Final Clinical Impression(s) / ED Diagnoses Final diagnoses:  Nausea vomiting and diarrhea    Rx / DC Orders ED Discharge Orders          Ordered    ondansetron (ZOFRAN ODT) 4 MG disintegrating tablet  Every 8 hours PRN        01/20/21 0927             Liberty Handy, PA-C 01/20/21 3536    Bethann Berkshire, MD 01/22/21 [phone]

## 2021-01-20 NOTE — ED Notes (Signed)
Patient is resting, appears to sleep.  No vomiting or diarrhea at this time.

## 2021-01-23 IMAGING — CT CT ABDOMEN AND PELVIS WITH CONTRAST
2 of 5 series · 12 of 46 positions shown, 14 images · IV contrast (APPLIED)
Comparison: 11/30/2017

CLINICAL DATA: Recent fall from balcony with subsequent seizure
activity

EXAM:
CT CHEST, ABDOMEN, AND PELVIS WITH CONTRAST
TECHNIQUE: Multidetector CT imaging of the chest, abdomen and pelvis was
performed following the standard protocol during bolus
administration of intravenous contrast.
CONTRAST:  100mL OMNIPAQUE IOHEXOL 300 MG/ML  SOLN

[Series 3: cap 5.0 i31f 2 · axial · 0.86mm/px · z∈[-750,-266]mm · 9 of 123 slices shown, 11 images]
[im 13/123  soft-tissue]
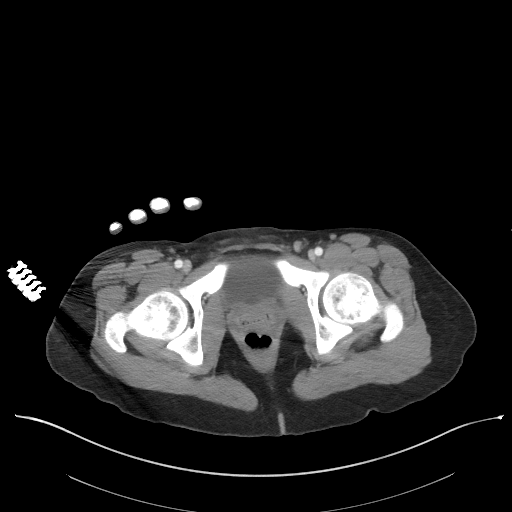
[im 13/123  bone]
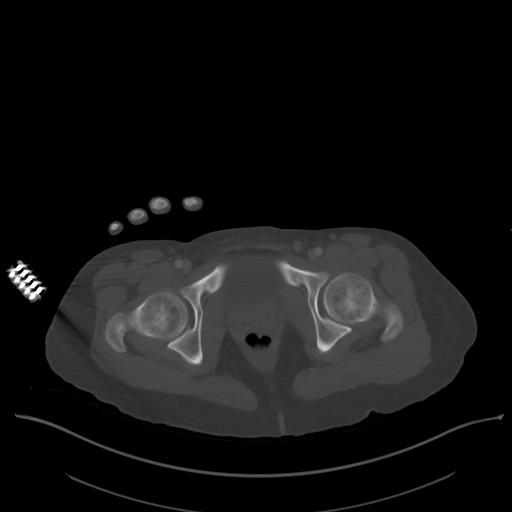
[im 25/123  soft-tissue]
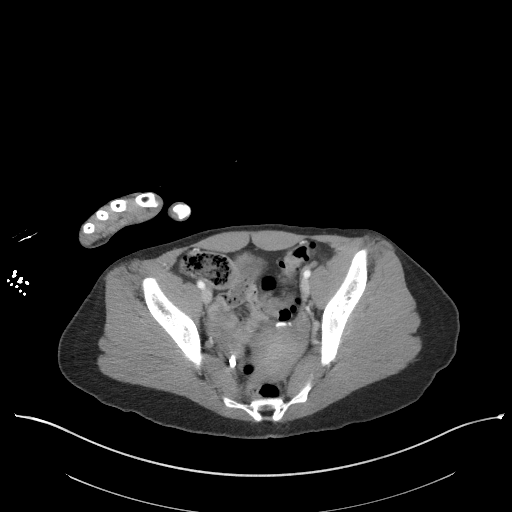
[im 37/123  soft-tissue]
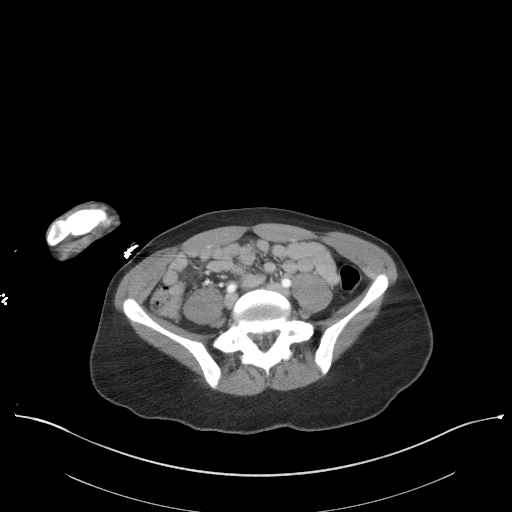
[im 49/123  soft-tissue]
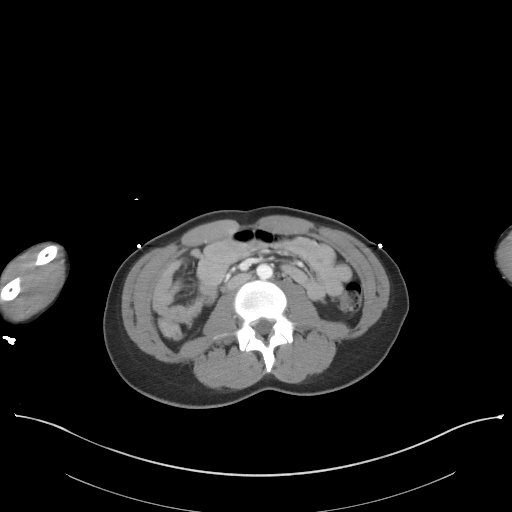
[im 62/123  soft-tissue]
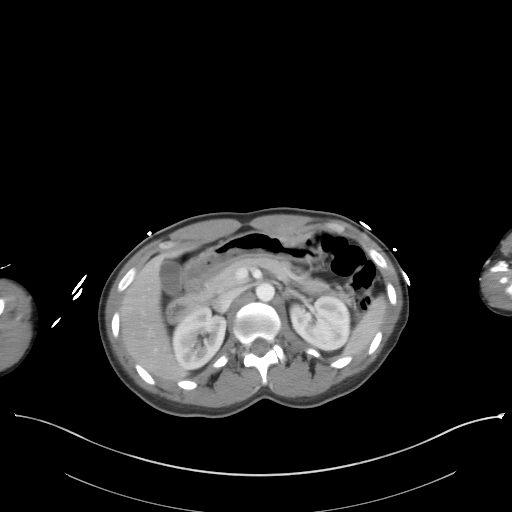
[im 74/123  soft-tissue]
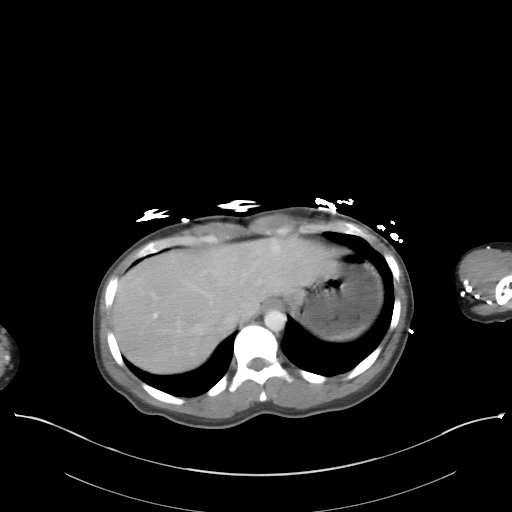
[im 86/123  soft-tissue]
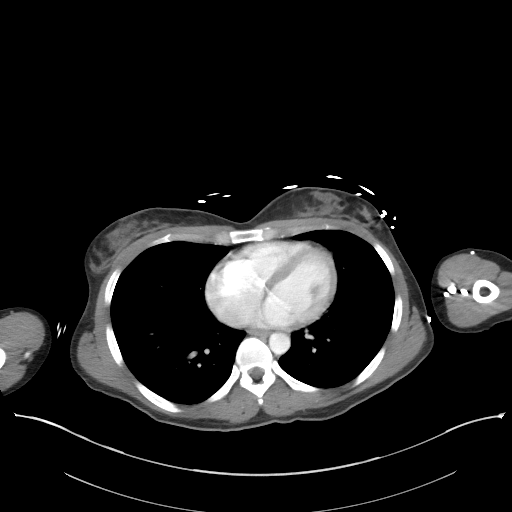
[im 98/123  soft-tissue]
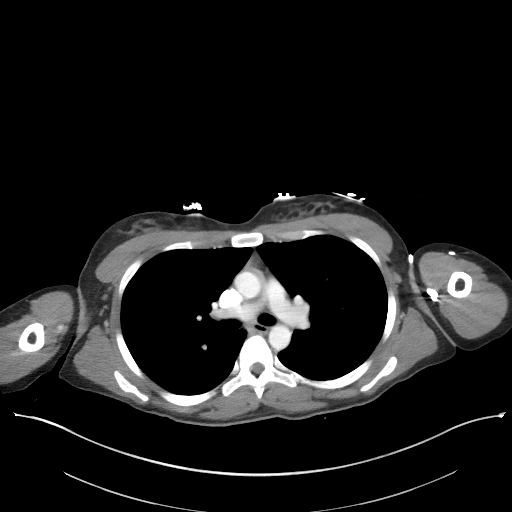
[im 110/123  soft-tissue]
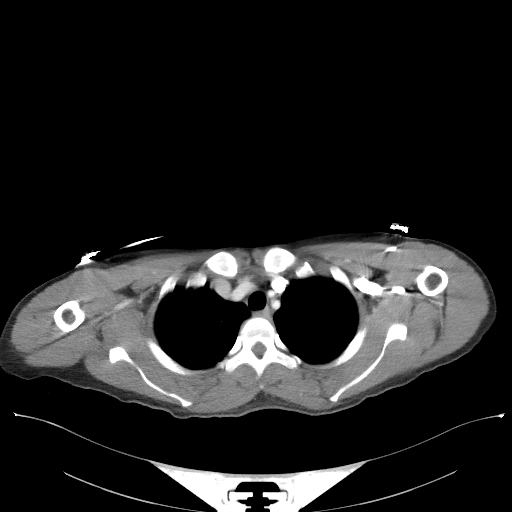
[im 110/123  bone]
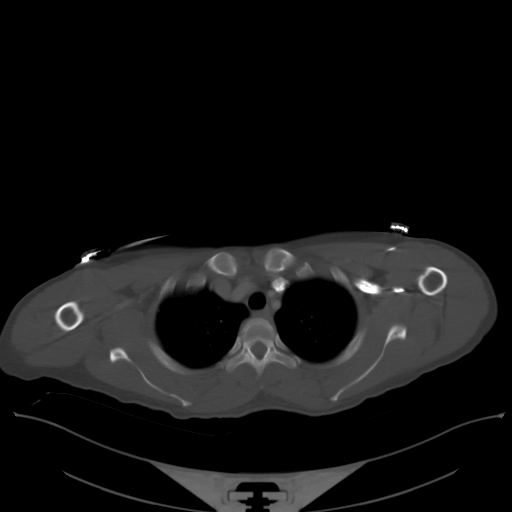

[Series 6: coronal · coronal · 0.77mm/px · 3 of 106 slices shown]
[im 36/106  soft-tissue]
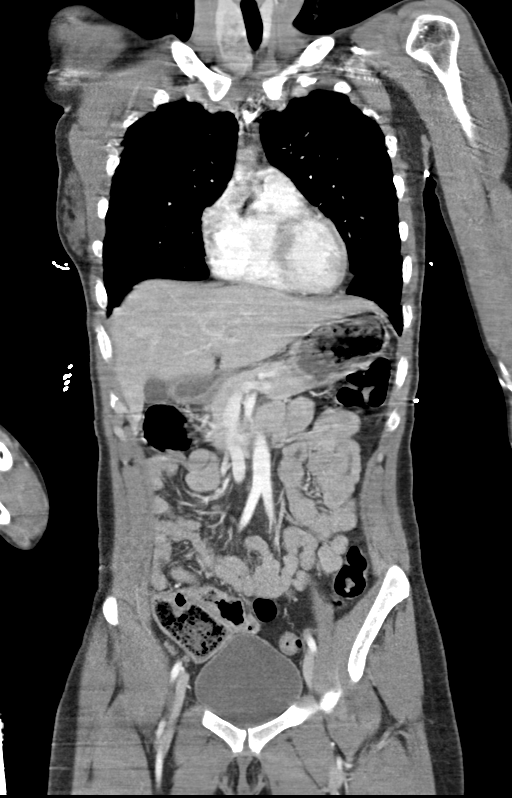
[im 47/106  soft-tissue]
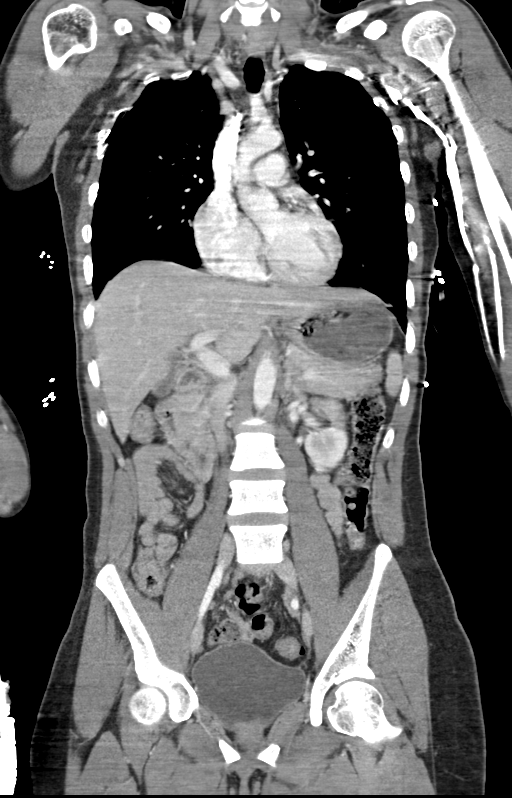
[im 59/106  soft-tissue]
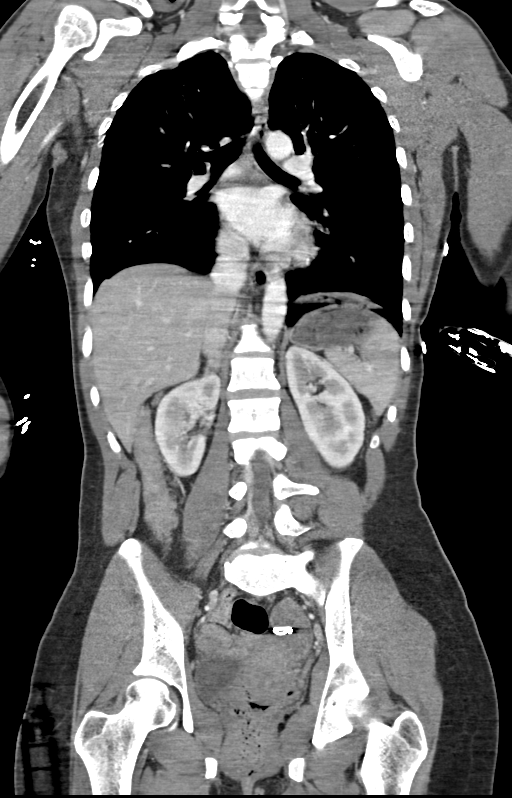

[12 of 46 positions shown; findings below may reference images not displayed]

FINDINGS: CT CHEST FINDINGS

Cardiovascular: Thoracic aorta is within normal limits. No
aneurysmal dilatation is seen. No cardiac enlargement is noted.
Visualized pulmonary artery shows no central pulmonary embolus. No
coronary calcifications are seen.

Mediastinum/Nodes: Thoracic inlet demonstrates enlargement of the
right lobe of the thyroid with a dominant 2.1 cm hypodense nodule.
No hilar or mediastinal adenopathy is noted. The esophagus is within
normal limits.

Lungs/Pleura: Lungs are clear. No pleural effusion or pneumothorax.

Musculoskeletal: No acute bony abnormality is noted.

CT ABDOMEN PELVIS FINDINGS

Hepatobiliary: No focal liver abnormality is seen. No gallstones,
gallbladder wall thickening, or biliary dilatation.

Pancreas: Unremarkable. No pancreatic ductal dilatation or
surrounding inflammatory changes.

Spleen: Normal in size without focal abnormality.

Adrenals/Urinary Tract: Adrenal glands are within normal limits
bilaterally. Kidneys demonstrate no renal calculi or obstructive
changes. Delayed images demonstrate normal excretion of contrast
material. The ureters are within normal limits. The bladder is well
distended.

Stomach/Bowel: Mild diverticular change of the colon is noted
without evidence of diverticulitis. The appendix is well visualized
and within normal limits. No obstructive or inflammatory changes of
the small bowel are seen. The stomach is within normal limits.

Vascular/Lymphatic: No significant vascular findings are present. No
enlarged abdominal or pelvic lymph nodes.

Reproductive: Uterus is retroflexed. No ovarian mass lesion is seen.
Changes of prior tubal ligation are noted.

Other: No abdominal wall hernia or abnormality. No abdominopelvic
ascites.

Musculoskeletal: No acute or significant osseous findings.
IMPRESSION: Large right-sided hypodense thyroid nodule. This is stable from a
prior CT examination from 11/30/2017.

Diverticulosis without diverticulitis.

No acute posttraumatic abnormality is noted.

## 2021-01-23 IMAGING — CT CT HEAD WITHOUT CONTRAST
4 of 8 series · 16 of 47 positions shown, 18 images · non-contrast
Comparison: None.

CLINICAL DATA: Fall and possible seizure

EXAM:
CT HEAD WITHOUT CONTRAST
CT CERVICAL SPINE WITHOUT CONTRAST
TECHNIQUE: Multidetector CT imaging of the head and cervical spine was
performed following the standard protocol without intravenous
contrast. Multiplanar CT image reconstructions of the cervical spine
were also generated.

[Series 5: head 2.0 h70h · axial · 0.47mm/px · z∈[-102,+22]mm · 7 of 84 slices shown, 9 images]
[im 11/84  brain]
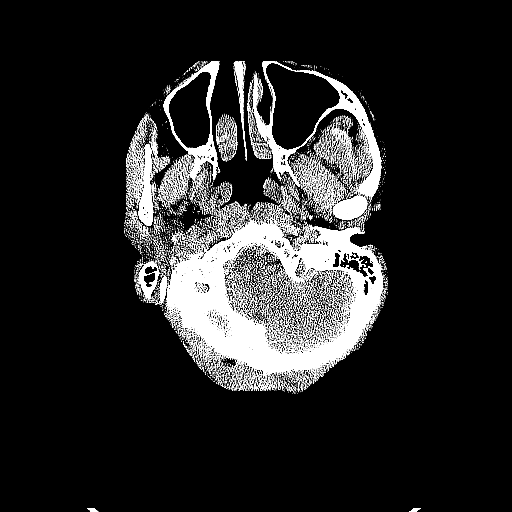
[im 11/84  bone]
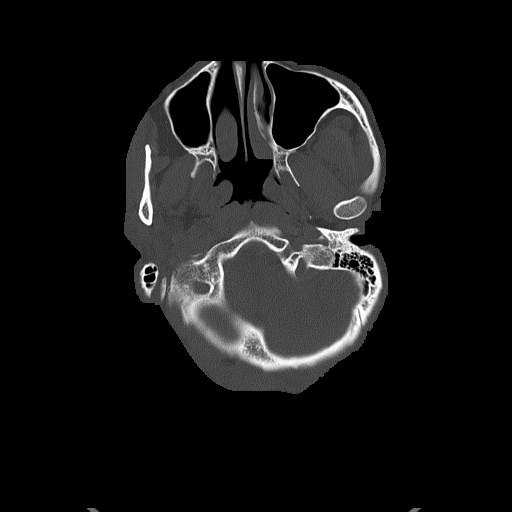
[im 21/84  brain]
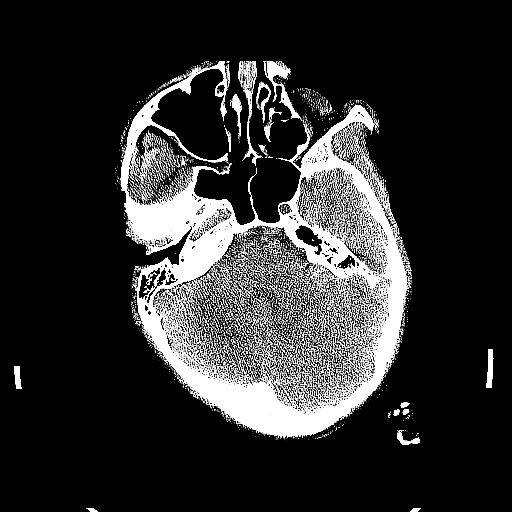
[im 32/84  brain]
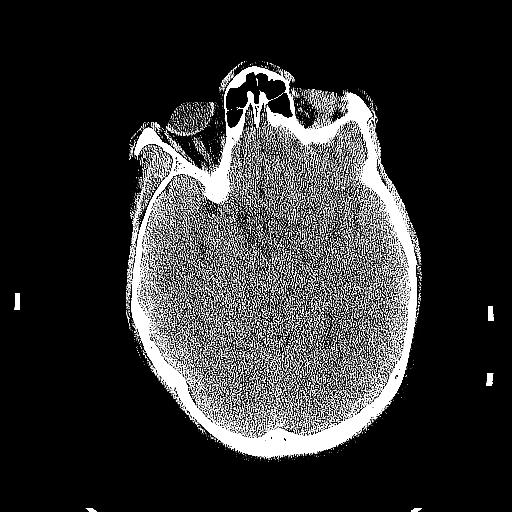
[im 42/84  brain]
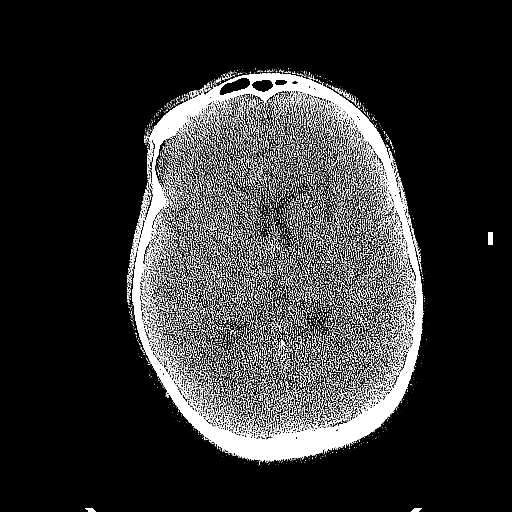
[im 52/84  brain]
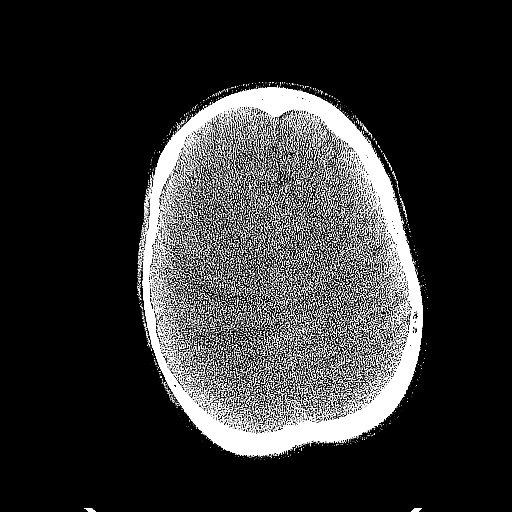
[im 52/84  bone]
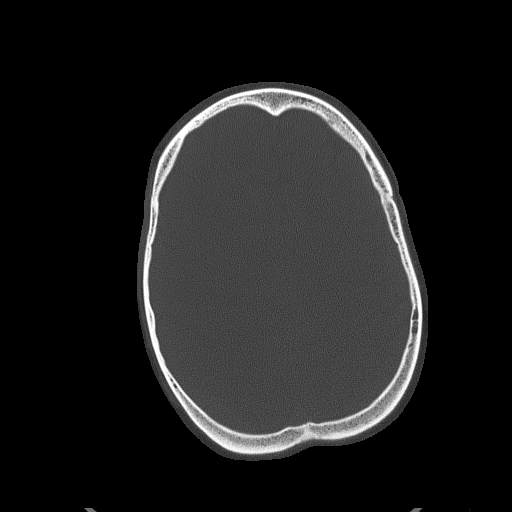
[im 63/84  brain]
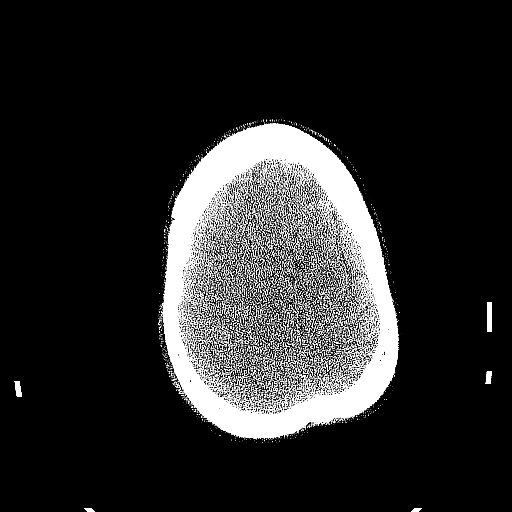
[im 73/84  brain]
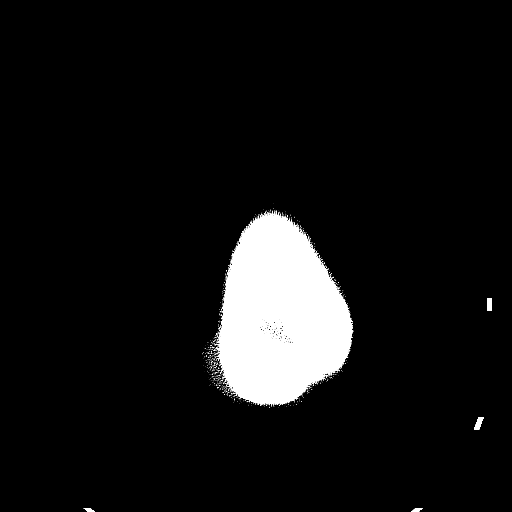

[Series 6: head 3.0 mpr cor · coronal · 0.33mm/px · 3 of 73 slices shown]
[im 19/73  brain]
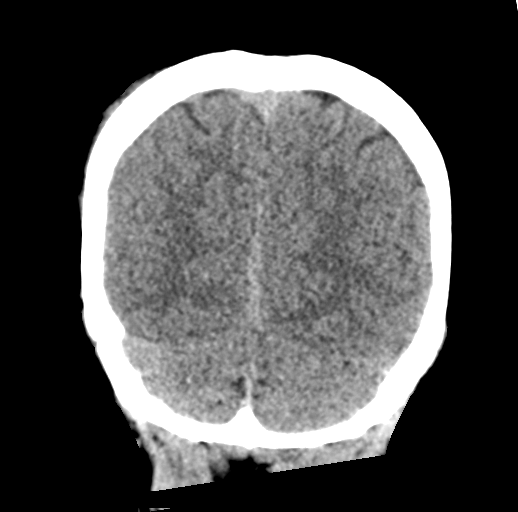
[im 37/73  brain]
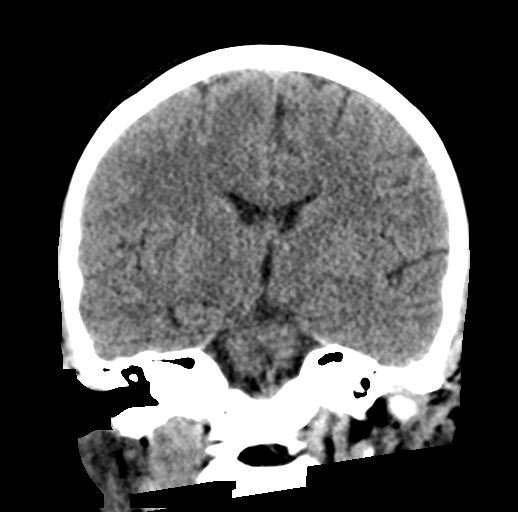
[im 55/73  brain]
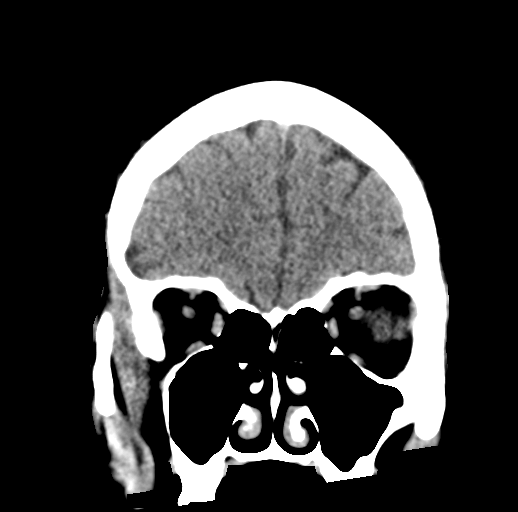

[Series 7: head 3.0 mpr sag · sagittal · 0.33mm/px · 1 of 53 slices shown]
[im 27/53  brain]
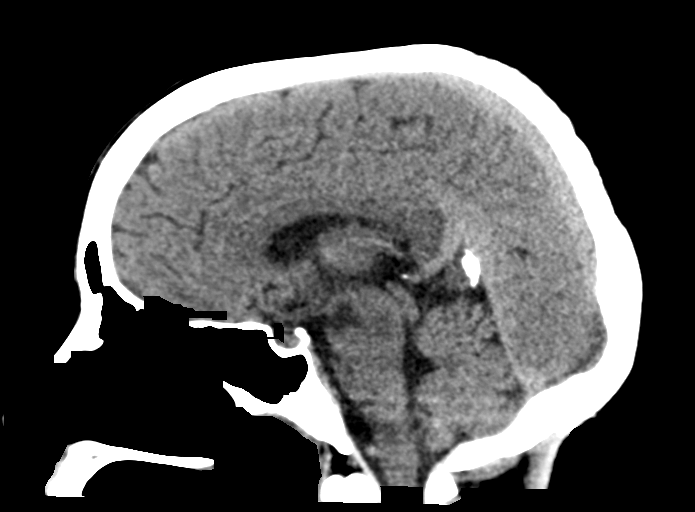

[Series 13: orthogonal axial st · axial · 0.21mm/px · z∈[-234,-157]mm · 5 of 69 slices shown]
[im 10/69  brain]
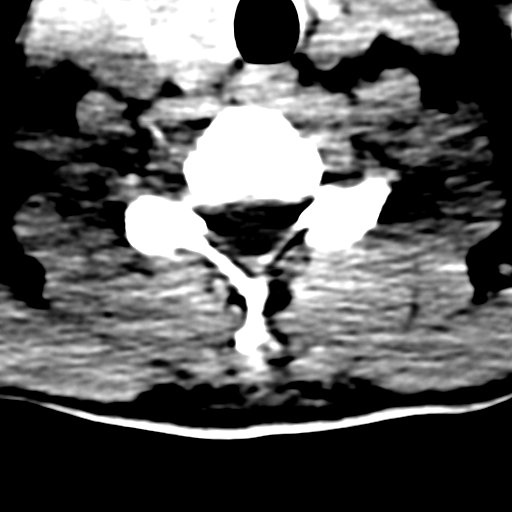
[im 20/69  brain]
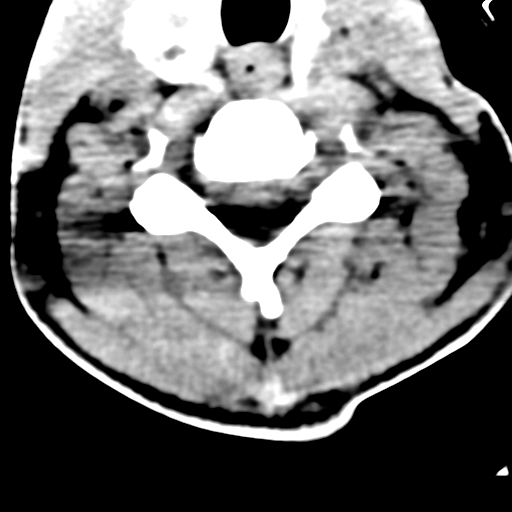
[im 30/69  brain]
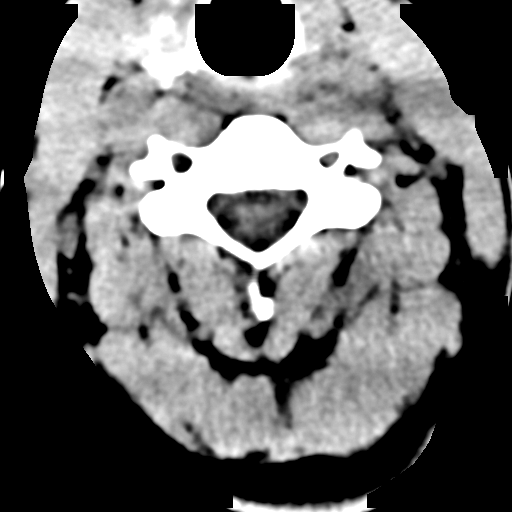
[im 39/69  brain]
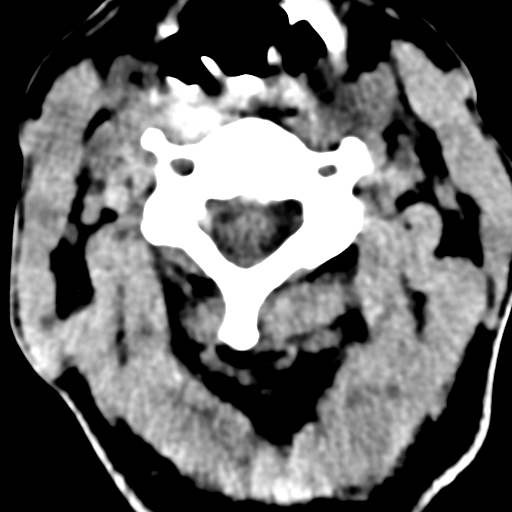
[im 49/69  brain]
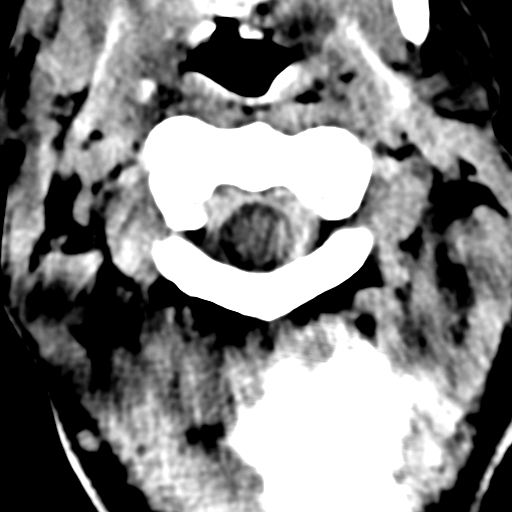

[16 of 47 positions shown; findings below may reference images not displayed]

FINDINGS: CT HEAD FINDINGS

Brain: There is no mass, hemorrhage or extra-axial collection. The
size and configuration of the ventricles and extra-axial CSF spaces
are normal. The brain parenchyma is normal, without evidence of
acute or chronic infarction.

Vascular: No abnormal hyperdensity of the major intracranial
arteries or dural venous sinuses. No intracranial atherosclerosis.

Skull: Small right parietal scalp hematoma.  No skull fracture.

Sinuses/Orbits: No fluid levels or advanced mucosal thickening of
the visualized paranasal sinuses. No mastoid or middle ear effusion.
The orbits are normal.

CT CERVICAL SPINE FINDINGS

Alignment: No static subluxation. Facets are aligned. Occipital
condyles are normally positioned.

Skull base and vertebrae: No acute fracture.

Soft tissues and spinal canal: No prevertebral fluid or swelling. No
visible canal hematoma.

Disc levels: No advanced spinal canal or neural foraminal stenosis.

Upper chest: No pneumothorax, pulmonary nodule or pleural effusion.

Other: Enlarged right thyroid lobe with multiple hypodense nodules.
IMPRESSION: 1. No acute intracranial abnormality.
2. Small right parietal scalp hematoma without skull fracture.
3. No acute fracture or static subluxation of the cervical spine.
4. Enlarged right thyroid lobe with multiple hypodense nodules.
Nonemergent thyroid ultrasound recommended for further
characterization on an outpatient basis.

## 2021-02-09 ENCOUNTER — Other Ambulatory Visit (HOSPITAL_COMMUNITY)
Admission: RE | Admit: 2021-02-09 | Discharge: 2021-02-09 | Disposition: A | Discharge status: Home / Self Care | Payer: Medicaid Other | Source: Ambulatory Visit | Attending: Obstetrics | Admitting: Obstetrics

## 2021-02-09 ENCOUNTER — Other Ambulatory Visit: Payer: Self-pay

## 2021-02-09 ENCOUNTER — Encounter: Payer: Self-pay | Admitting: Obstetrics

## 2021-02-09 ENCOUNTER — Ambulatory Visit (INDEPENDENT_AMBULATORY_CARE_PROVIDER_SITE_OTHER): Payer: Medicaid Other | Admitting: Obstetrics

## 2021-02-09 VITALS — BP 111/74 | HR 56 | Ht 63.0 in | Wt 123.5 lb

## 2021-02-09 DIAGNOSIS — Z113 Encounter for screening for infections with a predominantly sexual mode of transmission: Secondary | ICD-10-CM | POA: Diagnosis present

## 2021-02-09 DIAGNOSIS — Z01419 Encounter for gynecological examination (general) (routine) without abnormal findings: Secondary | ICD-10-CM | POA: Insufficient documentation

## 2021-02-09 DIAGNOSIS — R52 Pain, unspecified: Secondary | ICD-10-CM

## 2021-02-09 DIAGNOSIS — N898 Other specified noninflammatory disorders of vagina: Secondary | ICD-10-CM | POA: Diagnosis not present

## 2021-02-09 MED ORDER — IBUPROFEN 800 MG PO TABS: 800.0000 mg | ORAL_TABLET | Freq: Three times a day (TID) | ORAL | 5 refills | Status: DC | PRN

## 2021-02-09 NOTE — Progress Notes (Signed)
Subjective:        Barbara Gilbert is a 35 y.o. female here for a routine exam.  Current complaints: Vaginal discharge and toothache,.    Personal health questionnaire:  Is patient [name], have a family history of breast and/or ovarian cancer: no Is there a family history of uterine cancer diagnosed at age < 52, gastrointestinal cancer, urinary tract cancer, family member who is a Personnel officer syndrome-associated carrier: no Is the patient overweight and hypertensive, family history of diabetes, personal history of gestational diabetes, preeclampsia or PCOS: no Is patient over 69, have PCOS,  family history of premature CHD under age 74, diabetes, smoke, have hypertension or peripheral artery disease:  no At any time, has a partner hit, kicked or otherwise hurt or frightened you?: no Over the past 2 weeks, have you felt down, depressed or hopeless?: no Over the past 2 weeks, have you felt little interest or pleasure in doing things?:no   Gynecologic History Patient's last menstrual period was 01/15/2021 (within days). Contraception: tubal ligation Last Pap: 02-05-2020. Results were: normal Last mammogram: n/a. Results were: n/a  Obstetric History OB History  Gravida Para Term Preterm AB Living  3 1 1  0 2 1  SAB IAB Ectopic Multiple Live Births  0 2 0   1    # Outcome Date GA Lbr Len/2nd Weight Sex Delivery Anes PTL Lv  3 Term 01/21/07    M Vag-Spont   LIV  2 IAB           1 IAB             Past Medical History:  Diagnosis Date   Anxiety attack    Panic disorder    PTSD (post-traumatic stress disorder)    Held hostage while pregnant as a teen   Seizure (HCC) 03/11/2019   seizure x 1 after fall down stair and loss consciousness; ER visit and observation overnight in hospital   Seizure disorder [facility])    [name]    Past Surgical History:  Procedure Laterality Date   abortions     X 2----2012 and 2014   BIOPSY THYROID     1 week ago October 2020; awaiting  result   LAPAROSCOPIC TUBAL LIGATION Bilateral 10/04/2017   Procedure: LAPAROSCOPIC TUBAL LIGATION WITH FILSHIE CLIPS;  Surgeon: 10/06/2017, MD;  Location: [facility];  Service: Gynecology;  Laterality: Bilateral;   SHOULDER ARTHROSCOPY WITH BANKART REPAIR Right 06/05/2019   Procedure: SHOULDER ARTHROSCOPY WITH BANKART REPAIR;  Surgeon: 06/07/2019, MD;  Location: [facility];  Service: Orthopedics;  Laterality: Right;     Current Outpatient Medications:    ibuprofen (ADVIL) 800 MG tablet, Take 1 tablet (800 mg total) by mouth every 8 (eight) hours as needed., Disp: 30 tablet, Rfl: 5 No Known Allergies  Social History   Tobacco Use   Smoking status: Former    Packs/day: 0.40    Pack years: 0.00    Types: Cigarettes    Start date: 2009    Quit date: 01/26/2021    Years since quitting: 0.0   Smokeless tobacco: Never  Substance Use Topics   Alcohol use: No    Family History  Problem Relation Age of Onset   Fibromyalgia Mother    Mental illness Mother    Gout Father    Asthma Sister       Review of Systems  Constitutional: negative for fatigue and weight loss Respiratory: negative for cough and  wheezing Cardiovascular: negative for chest pain, fatigue and palpitations Gastrointestinal: negative for abdominal pain and change in bowel habits Musculoskeletal:negative for myalgias Neurological: negative for gait problems and tremors Behavioral/Psych: negative for abusive relationship, depression Endocrine: negative for temperature intolerance    Genitourinary: positive for vaginal discharge.  negative for abnormal menstrual periods, genital lesions, hot flashes, sexual problems  Integument/breast: negative for breast lump, breast tenderness, nipple discharge and skin lesion(s)    Objective:       BP 111/74 (BP Location: Right Arm, Patient Position: Sitting, Cuff Size: Normal)   Pulse (!) 56   Ht 5\' 3"  (1.6 m)   Wt 123 lb 8 oz (56 kg)   LMP 01/15/2021  (Within Days)   BMI 21.88 kg/m  General:   Alert and no distress  Skin:   no rash or abnormalities  Lungs:   clear to auscultation bilaterally  Heart:   regular rate and rhythm, S1, S2 normal, no murmur, click, rub or gallop  Breasts:   normal without suspicious masses, skin or nipple changes or axillary nodes  Abdomen:  normal findings: no organomegaly, soft, non-tender and no hernia  Pelvis:  External genitalia: normal general appearance Urinary system: urethral meatus normal and bladder without fullness, nontender Vaginal: normal without tenderness, induration or masses Cervix: normal appearance Adnexa: normal bimanual exam Uterus: anteverted and non-tender, normal size   Lab Review Urine pregnancy test Labs reviewed yes Radiologic studies reviewed no  I have spent a total of 20 minutes of face-to-face time, excluding clinical staff time, reviewing notes and preparing to see patient, ordering tests and/or medications, and counseling the patient.   Assessment:     1. Encounter for routine gynecological examination with Papanicolaou smear of cervix Rx: - Cytology - PAP( [place])  2. Vaginal discharge Rx: - Cervicovaginal ancillary only( [place])  3. Screen for STD (sexually transmitted disease) Rx: - RPR+HBsAg+HCVAb+...  4. Pain.  Toothache after getting a filling. - follow up with Dentist Rx: - ibuprofen (ADVIL) 800 MG tablet; Take 1 tablet (800 mg total) by mouth every 8 (eight) hours as needed.  Dispense: 30 tablet; Refill: 5     Plan:    Education reviewed: calcium supplements, depression evaluation, low fat, low cholesterol diet, safe sex/STD prevention, self breast exams, and weight bearing exercise. Follow up in: 1 year.   Meds ordered this encounter  Medications   ibuprofen (ADVIL) 800 MG tablet    Sig: Take 1 tablet (800 mg total) by mouth every 8 (eight) hours as needed.    Dispense:  30 tablet    Refill:  5   Orders Placed This Encounter   Procedures   RPR+HBsAg+HCVAb+...    03/17/2021, MD 02/09/2021 2:20 PM      Patient in clinic for annual exam. PAP is due, patient desires std screening along with blood work. Patient denies vaginal discharge, odor and/or itching. Denies UTI symptoms.04/12/2021, Daphine Deutscher, RN

## 2021-02-10 LAB — CERVICOVAGINAL ANCILLARY ONLY
Bacterial Vaginitis (gardnerella): NEGATIVE
Candida Glabrata: NEGATIVE
Candida Vaginitis: POSITIVE — AB
Chlamydia: NEGATIVE
Comment: NEGATIVE
Comment: NEGATIVE
Comment: NEGATIVE
Comment: NEGATIVE
Comment: NEGATIVE
Comment: NORMAL
Neisseria Gonorrhea: NEGATIVE
Trichomonas: NEGATIVE

## 2021-02-10 LAB — RPR+HBSAG+HCVAB+...
HIV Screen 4th Generation wRfx: NONREACTIVE
Hep C Virus Ab: <0.1 s/co ratio (ref 0.0–0.9)
Hepatitis B Surface Ag: NEGATIVE
RPR Ser Ql: NONREACTIVE

## 2021-02-11 ENCOUNTER — Other Ambulatory Visit: Payer: Self-pay | Admitting: Obstetrics

## 2021-02-11 DIAGNOSIS — B379 Candidiasis, unspecified: Secondary | ICD-10-CM

## 2021-02-11 LAB — CYTOLOGY - PAP
Comment: NEGATIVE
Diagnosis: NEGATIVE
High risk HPV: NEGATIVE

## 2021-02-11 MED ORDER — FLUCONAZOLE 150 MG PO TABS: 150.0000 mg | ORAL_TABLET | Freq: Once | ORAL | 0 refills | Status: AC

## 2021-02-21 ENCOUNTER — Other Ambulatory Visit: Payer: Self-pay

## 2021-02-21 ENCOUNTER — Encounter (HOSPITAL_COMMUNITY): Payer: Self-pay | Admitting: Emergency Medicine

## 2021-02-21 ENCOUNTER — Emergency Department (HOSPITAL_COMMUNITY)
Admission: EM | Admit: 2021-02-21 | Discharge: 2021-02-21 | Disposition: A | Discharge status: Home / Self Care | Payer: Medicaid Other | Attending: Emergency Medicine | Admitting: Emergency Medicine

## 2021-02-21 DIAGNOSIS — H9202 Otalgia, left ear: Secondary | ICD-10-CM | POA: Insufficient documentation

## 2021-02-21 DIAGNOSIS — M26629 Arthralgia of temporomandibular joint, unspecified side: Secondary | ICD-10-CM

## 2021-02-21 DIAGNOSIS — R6884 Jaw pain: Secondary | ICD-10-CM | POA: Diagnosis not present

## 2021-02-21 DIAGNOSIS — K117 Disturbances of salivary secretion: Secondary | ICD-10-CM | POA: Diagnosis not present

## 2021-02-21 DIAGNOSIS — Z87891 Personal history of nicotine dependence: Secondary | ICD-10-CM | POA: Insufficient documentation

## 2021-02-21 DIAGNOSIS — K002 Abnormalities of size and form of teeth: Secondary | ICD-10-CM | POA: Diagnosis not present

## 2021-02-21 MED ORDER — CYCLOBENZAPRINE HCL 10 MG PO TABS
10.0000 mg | ORAL_TABLET | Freq: Every evening | ORAL | 0 refills | Status: DC
Start: 2021-02-21 — End: 2024-03-28
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
## 2021-02-21 NOTE — ED Notes (Signed)
Provided pt with warm compress

## 2021-02-21 NOTE — ED Triage Notes (Signed)
Pt presents c/o of left sided jaw pain since July 6th. Pt states she went to her dentist for a tooth filling. She states " the doctor shifted my jaw during the procedure and I felt a click, and then the next day after the numbing medication wore off I started to have a lot of pain in my jaw on that side." Pt states pain is just consistently there, she denies pain changing with eating or biting down. Pt is also complaining of an associated ear pain on the left side. Pt states she has been unable to sleep since this occurred. Pt states she had an appt with her obgyn on July 7th for follow up and she told the provider about her jaw pain and was given 800 mg of ibuprofen which she states she has been having to take 4-5 times a day with no relief. Pt denies any other complaints at this time.

## 2021-02-21 NOTE — Discharge Instructions (Addendum)
You you came to the emerge department today to be evaluated for your jaw pain.  The jaw is not dislocated.  The most likely having issues with your temporomandibular joint.  I have given you prescription for a muscle relaxer Flexeril.  Take 1 pill nightly at that time.  You may also try wearing a mouthguard at night to help reduce your symptoms.  Please take Ibuprofen (Advil, motrin) and Tylenol (acetaminophen) to relieve your pain.    You may take up to 600 MG (3 pills) of normal strength ibuprofen every 8 hours as needed.   You make take tylenol, up to 1,000 mg (two extra strength pills) every 8 hours as needed.   It is safe to take ibuprofen and tylenol at the same time as they work differently.   Do not take more than 3,000 mg tylenol in a 24 hour period (not more than one dose every 8 hours.  Please check all medication labels as many medications such as pain and cold medications may contain tylenol.  Do not drink alcohol while taking these medications.  Do not take other NSAID'S while taking ibuprofen (such as aleve or naproxen).  Please take ibuprofen with food to decrease stomach upset.  I have given you information to follow-up with a maxillofacial surgeon.  Please follow-up with them if your symptoms do not improve after 2 weeks.  Get help right away if: Your jaw locks open or closed.

## 2021-03-04 ENCOUNTER — Other Ambulatory Visit: Payer: Self-pay

## 2021-03-04 ENCOUNTER — Ambulatory Visit (HOSPITAL_COMMUNITY)
Admission: EM | Admit: 2021-03-04 | Discharge: 2021-03-04 | Disposition: A | Discharge status: Home / Self Care | Payer: Medicaid Other | Attending: Psychiatry | Admitting: Psychiatry

## 2021-03-04 ENCOUNTER — Other Ambulatory Visit (HOSPITAL_COMMUNITY)
Admission: RE | Admit: 2021-03-04 | Discharge: 2021-03-04 | Disposition: A | Discharge status: Home / Self Care | Payer: Medicaid Other | Source: Ambulatory Visit | Attending: Obstetrics | Admitting: Obstetrics

## 2021-03-04 ENCOUNTER — Ambulatory Visit (INDEPENDENT_AMBULATORY_CARE_PROVIDER_SITE_OTHER): Payer: Medicaid Other

## 2021-03-04 VITALS — Ht 63.0 in | Wt 130.0 lb

## 2021-03-04 DIAGNOSIS — Z79899 Other long term (current) drug therapy: Secondary | ICD-10-CM | POA: Insufficient documentation

## 2021-03-04 DIAGNOSIS — T7621XA Adult sexual abuse, suspected, initial encounter: Secondary | ICD-10-CM | POA: Insufficient documentation

## 2021-03-04 DIAGNOSIS — F4321 Adjustment disorder with depressed mood: Secondary | ICD-10-CM | POA: Insufficient documentation

## 2021-03-04 DIAGNOSIS — Z62810 Personal history of physical and sexual abuse in childhood: Secondary | ICD-10-CM | POA: Insufficient documentation

## 2021-03-04 DIAGNOSIS — Z113 Encounter for screening for infections with a predominantly sexual mode of transmission: Secondary | ICD-10-CM | POA: Diagnosis not present

## 2021-03-04 DIAGNOSIS — Z91410 Personal history of adult physical and sexual abuse: Secondary | ICD-10-CM | POA: Diagnosis not present

## 2021-03-04 DIAGNOSIS — Z202 Contact with and (suspected) exposure to infections with a predominantly sexual mode of transmission: Secondary | ICD-10-CM | POA: Insufficient documentation

## 2021-03-04 DIAGNOSIS — Z7251 High risk heterosexual behavior: Secondary | ICD-10-CM | POA: Diagnosis not present

## 2021-03-04 NOTE — ED Notes (Signed)
Discharge instructions provided and Pt stated understanding. Pt alert, orient and ambulatory. Personal belongings returned from the orange locker. Pt escorted to the front lobby.

## 2021-03-04 NOTE — Discharge Instructions (Addendum)
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
## 2021-03-05 ENCOUNTER — Other Ambulatory Visit: Payer: Self-pay

## 2021-03-05 DIAGNOSIS — B9689 Other specified bacterial agents as the cause of diseases classified elsewhere: Secondary | ICD-10-CM

## 2021-03-05 DIAGNOSIS — N760 Acute vaginitis: Secondary | ICD-10-CM

## 2021-03-05 LAB — RPR+HBSAG+HCVAB+...
HIV Screen 4th Generation wRfx: NONREACTIVE
Hep C Virus Ab: <0.1 s/co ratio (ref 0.0–0.9)
Hepatitis B Surface Ag: NEGATIVE
RPR Ser Ql: NONREACTIVE

## 2021-03-05 LAB — CERVICOVAGINAL ANCILLARY ONLY
Bacterial Vaginitis (gardnerella): POSITIVE — AB
Candida Glabrata: NEGATIVE
Candida Vaginitis: NEGATIVE
Chlamydia: NEGATIVE
Comment: NEGATIVE
Comment: NEGATIVE
Comment: NEGATIVE
Comment: NEGATIVE
Comment: NEGATIVE
Comment: NORMAL
Neisseria Gonorrhea: NEGATIVE
Trichomonas: NEGATIVE

## 2021-03-05 MED ORDER — METRONIDAZOLE 500 MG PO TABS: 500.0000 mg | ORAL_TABLET | Freq: Two times a day (BID) | ORAL | 0 refills | Status: DC
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
## 2021-03-23 ENCOUNTER — Telehealth: Payer: Medicaid Other | Admitting: Physician Assistant

## 2021-03-23 DIAGNOSIS — B379 Candidiasis, unspecified: Secondary | ICD-10-CM | POA: Diagnosis not present

## 2021-03-23 DIAGNOSIS — T3695XA Adverse effect of unspecified systemic antibiotic, initial encounter: Secondary | ICD-10-CM | POA: Diagnosis not present

## 2021-03-23 MED ORDER — FLUCONAZOLE 150 MG PO TABS: 150.0000 mg | ORAL_TABLET | Freq: Once | ORAL | 0 refills | Status: AC

## 2021-03-23 NOTE — Progress Notes (Signed)
I have spent 5 minutes in review of e-visit questionnaire, review and updating patient chart, medical decision making and response to patient.   Vana Arif Cody Aggie Douse, PA-C    

## 2021-03-23 NOTE — Progress Notes (Signed)

## 2021-04-28 ENCOUNTER — Encounter (HOSPITAL_COMMUNITY): Payer: Self-pay | Admitting: Psychiatry

## 2021-04-28 ENCOUNTER — Ambulatory Visit (INDEPENDENT_AMBULATORY_CARE_PROVIDER_SITE_OTHER): Payer: Medicaid Other | Admitting: Psychiatry

## 2021-04-28 ENCOUNTER — Other Ambulatory Visit: Payer: Self-pay

## 2021-04-28 VITALS — BP 143/64 | HR 67 | Ht 60.0 in | Wt 129.0 lb

## 2021-04-28 DIAGNOSIS — R561 Post traumatic seizures: Secondary | ICD-10-CM

## 2021-04-28 DIAGNOSIS — F411 Generalized anxiety disorder: Secondary | ICD-10-CM | POA: Diagnosis not present

## 2021-04-28 MED ORDER — GABAPENTIN 300 MG PO CAPS: 300.0000 mg | ORAL_CAPSULE | Freq: Three times a day (TID) | ORAL | 3 refills | Status: DC

## 2021-04-28 MED ORDER — PRAZOSIN HCL 1 MG PO CAPS: 1.0000 mg | ORAL_CAPSULE | Freq: Every day | ORAL | 3 refills | Status: DC
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
## 2021-05-24 ENCOUNTER — Other Ambulatory Visit (HOSPITAL_COMMUNITY): Payer: Self-pay | Admitting: Psychiatry

## 2021-05-24 ENCOUNTER — Telehealth (HOSPITAL_COMMUNITY): Payer: Self-pay | Admitting: *Deleted

## 2021-05-24 DIAGNOSIS — R561 Post traumatic seizures: Secondary | ICD-10-CM

## 2021-05-24 NOTE — Telephone Encounter (Signed)
Provider spoke to patient who notes that she found the gabapentin effective as it caused her to have acne and headaches.  Patient notes that she receives Klonopin from her primary care doctor once a month however notes she only gets a 10-day supply of 1 mg.  Provider informed patient that Klonopin would not be increased at this time.  Provider discussed starting BuSpar however patient notes that she found that ineffective.  She notes that she does not want an SSRI/SNRI.  Provider discussed potentially starting Seroquel to help manage anxiety.  Patient notes that she would like to do her research prior to starting this medication.  Provider endorsed understanding and agreed.  No other concerns at this time.

## 2021-05-24 NOTE — Telephone Encounter (Signed)
Pt called --stated gabapentin (NEURONTIN) 300 MG capsule is not working, causing acne break-out, and headaches. Pt requesting for another option.

## 2021-05-26 ENCOUNTER — Other Ambulatory Visit: Payer: Self-pay | Admitting: Internal Medicine

## 2021-05-26 ENCOUNTER — Ambulatory Visit (HOSPITAL_COMMUNITY): Payer: Medicaid Other | Admitting: Licensed Clinical Social Worker

## 2021-05-27 LAB — LIPID PANEL
Cholesterol: 116 mg/dL (ref <200)
HDL: 55 mg/dL (ref >=50)
LDL Cholesterol (Calc): 44 mg/dL (calc)
Non-HDL Cholesterol (Calc): 61 mg/dL (calc) (ref <130)
Total CHOL/HDL Ratio: 2.1 (calc) (ref <5.0)
Triglycerides: 85 mg/dL (ref <150)

## 2021-05-27 LAB — COMPLETE METABOLIC PANEL WITH GFR
AG Ratio: 1.7 (calc) (ref 1.0–2.5)
ALT: 12 U/L (ref 6–29)
AST: 15 U/L (ref 10–30)
Albumin: 4 g/dL (ref 3.6–5.1)
Alkaline phosphatase (APISO): 58 U/L (ref 31–125)
BUN: 10 mg/dL (ref 7–25)
CO2: 21 mmol/L (ref 20–32)
Calcium: 9.1 mg/dL (ref 8.6–10.2)
Chloride: 110 mmol/L (ref 98–110)
Creat: 0.85 mg/dL (ref 0.50–0.97)
Globulin: 2.3 g/dL (calc) (ref 1.9–3.7)
Glucose, Bld: 112 mg/dL — ABNORMAL HIGH (ref 65–99)
Potassium: 3.8 mmol/L (ref 3.5–5.3)
Sodium: 141 mmol/L (ref 135–146)
Total Bilirubin: 0.3 mg/dL (ref 0.2–1.2)
Total Protein: 6.3 g/dL (ref 6.1–8.1)
eGFR: 93 mL/min/{1.73_m2} (ref >=60)

## 2021-05-27 LAB — CBC
HCT: 40 % (ref 35.0–45.0)
Hemoglobin: 13.1 g/dL (ref 11.7–15.5)
MCH: 31.1 pg (ref 27.0–33.0)
MCHC: 32.8 g/dL (ref 32.0–36.0)
MCV: 95 fL (ref 80.0–100.0)
MPV: 11.3 fL (ref 7.5–12.5)
Platelets: 250 10*3/uL (ref 140–400)
RBC: 4.21 10*6/uL (ref 3.80–5.10)
RDW: 11.8 % (ref 11.0–15.0)
WBC: 8.9 10*3/uL (ref 3.8–10.8)

## 2021-05-27 LAB — VITAMIN D 25 HYDROXY (VIT D DEFICIENCY, FRACTURES): Vit D, 25-Hydroxy: 73 ng/mL (ref 30–100)

## 2021-05-27 LAB — TSH: TSH: 1.04 mIU/L

## 2021-07-26 ENCOUNTER — Ambulatory Visit (HOSPITAL_COMMUNITY): Payer: Medicaid Other | Admitting: Licensed Clinical Social Worker

## 2021-07-29 ENCOUNTER — Encounter (HOSPITAL_COMMUNITY): Payer: Medicaid Other | Admitting: Psychiatry

## 2022-02-10 ENCOUNTER — Ambulatory Visit (INDEPENDENT_AMBULATORY_CARE_PROVIDER_SITE_OTHER): Payer: Medicaid Other | Admitting: Obstetrics and Gynecology

## 2022-02-10 ENCOUNTER — Ambulatory Visit: Payer: Medicaid Other | Admitting: Obstetrics

## 2022-02-10 ENCOUNTER — Other Ambulatory Visit (HOSPITAL_COMMUNITY)
Admission: RE | Admit: 2022-02-10 | Discharge: 2022-02-10 | Disposition: A | Discharge status: Home / Self Care | Payer: Medicaid Other | Source: Ambulatory Visit | Attending: Obstetrics | Admitting: Obstetrics

## 2022-02-10 ENCOUNTER — Encounter: Payer: Self-pay | Admitting: Obstetrics and Gynecology

## 2022-02-10 VITALS — BP 117/75 | HR 59 | Ht 63.0 in | Wt 119.0 lb

## 2022-02-10 DIAGNOSIS — Z113 Encounter for screening for infections with a predominantly sexual mode of transmission: Secondary | ICD-10-CM | POA: Diagnosis not present

## 2022-02-10 DIAGNOSIS — Z01419 Encounter for gynecological examination (general) (routine) without abnormal findings: Secondary | ICD-10-CM
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
## 2022-02-10 NOTE — Progress Notes (Signed)
Pt is in the office for annual Last pap 02/09/2021, pt desires pap today and full std screen Hx of BTL LMP 01/12/22

## 2022-02-11 LAB — HEPATITIS C ANTIBODY: Hep C Virus Ab: NONREACTIVE

## 2022-02-11 LAB — HIV ANTIBODY (ROUTINE TESTING W REFLEX): HIV Screen 4th Generation wRfx: NONREACTIVE

## 2022-02-11 LAB — HEPATITIS B SURFACE ANTIGEN: Hepatitis B Surface Ag: NEGATIVE

## 2022-02-11 LAB — RPR: RPR Ser Ql: NONREACTIVE

## 2022-02-14 LAB — CERVICOVAGINAL ANCILLARY ONLY
Bacterial Vaginitis (gardnerella): POSITIVE — AB
Candida Glabrata: NEGATIVE
Candida Vaginitis: NEGATIVE
Chlamydia: NEGATIVE
Comment: NEGATIVE
Comment: NEGATIVE
Comment: NEGATIVE
Comment: NEGATIVE
Comment: NEGATIVE
Comment: NORMAL
Neisseria Gonorrhea: NEGATIVE
Trichomonas: NEGATIVE

## 2022-02-14 LAB — CYTOLOGY - PAP
Comment: NEGATIVE
Diagnosis: NEGATIVE
High risk HPV: NEGATIVE

## 2022-02-15 ENCOUNTER — Other Ambulatory Visit: Payer: Self-pay

## 2022-02-15 ENCOUNTER — Encounter: Payer: Self-pay | Admitting: Obstetrics and Gynecology

## 2022-02-15 MED ORDER — METRONIDAZOLE 500 MG PO TABS: 500.0000 mg | ORAL_TABLET | Freq: Two times a day (BID) | ORAL | 0 refills | Status: DC

## 2022-02-15 MED ORDER — FLUCONAZOLE 150 MG PO TABS: 150.0000 mg | ORAL_TABLET | Freq: Once | ORAL | 0 refills | Status: DC

## 2022-03-02 ENCOUNTER — Other Ambulatory Visit: Payer: Self-pay

## 2022-03-02 DIAGNOSIS — B379 Candidiasis, unspecified: Secondary | ICD-10-CM

## 2022-03-02 MED ORDER — FLUCONAZOLE 150 MG PO TABS: 150.0000 mg | ORAL_TABLET | Freq: Once | ORAL | 0 refills | Status: AC

## 2022-03-04 ENCOUNTER — Other Ambulatory Visit: Payer: Self-pay | Admitting: Emergency Medicine

## 2022-03-04 DIAGNOSIS — B379 Candidiasis, unspecified: Secondary | ICD-10-CM

## 2022-03-04 MED ORDER — FLUCONAZOLE 150 MG PO TABS: 150.0000 mg | ORAL_TABLET | Freq: Once | ORAL | 0 refills | Status: AC

## 2022-03-04 NOTE — Progress Notes (Signed)
Diflucan sent per protocol for vaginal itching, irritation following Flagyl use.

## 2022-05-13 ENCOUNTER — Other Ambulatory Visit: Payer: Self-pay

## 2022-05-13 ENCOUNTER — Encounter: Payer: Self-pay | Admitting: Obstetrics and Gynecology

## 2022-05-18 ENCOUNTER — Ambulatory Visit (INDEPENDENT_AMBULATORY_CARE_PROVIDER_SITE_OTHER): Payer: Medicaid Other | Admitting: *Deleted

## 2022-05-18 ENCOUNTER — Other Ambulatory Visit (HOSPITAL_COMMUNITY)
Admission: RE | Admit: 2022-05-18 | Discharge: 2022-05-18 | Disposition: A | Discharge status: Home / Self Care | Payer: Medicaid Other | Source: Ambulatory Visit | Attending: Obstetrics and Gynecology | Admitting: Obstetrics and Gynecology

## 2022-05-18 VITALS — BP 114/73 | HR 60

## 2022-05-18 DIAGNOSIS — N898 Other specified noninflammatory disorders of vagina: Secondary | ICD-10-CM | POA: Insufficient documentation

## 2022-05-18 DIAGNOSIS — Z113 Encounter for screening for infections with a predominantly sexual mode of transmission: Secondary | ICD-10-CM

## 2022-05-18 NOTE — Progress Notes (Signed)
SUBJECTIVE:  36 y.o. female who desires a STI screen. Reports small amount of white vaginal discharge. Denies bleeding or significant pelvic pain. No UTI symptoms. Denies history of known exposure to STD.  LMP: 05/06/22  OBJECTIVE:  She appears well.   ASSESSMENT:  STI Screen   PLAN:  Pt offered STI blood screening-requested GC, chlamydia, and trichomonas probe sent to lab.  Treatment: To be determined once lab results are received.  Pt follow up as needed.

## 2022-05-18 NOTE — Progress Notes (Signed)
Agree with nurses's documentation of this patient's clinic encounter.  Biruk Troia L, MD  

## 2022-05-19 ENCOUNTER — Other Ambulatory Visit: Payer: Self-pay | Admitting: Emergency Medicine

## 2022-05-19 LAB — HIV ANTIBODY (ROUTINE TESTING W REFLEX): HIV Screen 4th Generation wRfx: NONREACTIVE

## 2022-05-19 LAB — CERVICOVAGINAL ANCILLARY ONLY
Bacterial Vaginitis (gardnerella): POSITIVE — AB
Candida Glabrata: NEGATIVE
Candida Vaginitis: NEGATIVE
Chlamydia: NEGATIVE
Comment: NEGATIVE
Comment: NEGATIVE
Comment: NEGATIVE
Comment: NEGATIVE
Comment: NEGATIVE
Comment: NORMAL
Neisseria Gonorrhea: NEGATIVE
Trichomonas: NEGATIVE

## 2022-05-19 LAB — RPR: RPR Ser Ql: NONREACTIVE

## 2022-05-19 LAB — HEPATITIS C ANTIBODY: Hep C Virus Ab: NONREACTIVE

## 2022-05-19 LAB — HEPATITIS B SURFACE ANTIGEN: Hepatitis B Surface Ag: NEGATIVE

## 2022-05-19 MED ORDER — METRONIDAZOLE 0.75 % VA GEL: 1.0000 | Freq: Every day | VAGINAL | 1 refills | Status: DC

## 2022-05-19 NOTE — Progress Notes (Signed)
Meto gel sent per protocol for BV.

## 2022-07-21 IMAGING — US US THYROID
1 series · 13 of 25 positions shown · non-contrast
Comparison: Prior thyroid biopsy images [id number]

CLINICAL DATA: Goiter. 33-year-old female with a history of right
mid thyroid nodule which was previously biopsied in Thursday May, 2019
(initial pathologic diagnosis of indeterminate [REDACTED] 3
subsequently sent for Afirma testing and re-classified as benign).

EXAM:
THYROID ULTRASOUND
TECHNIQUE: Ultrasound examination of the thyroid gland and adjacent soft
tissues was performed.

[Series 1: us thyroid · 0.07mm/px · 13 of 33 slices shown]
[im 1/33]
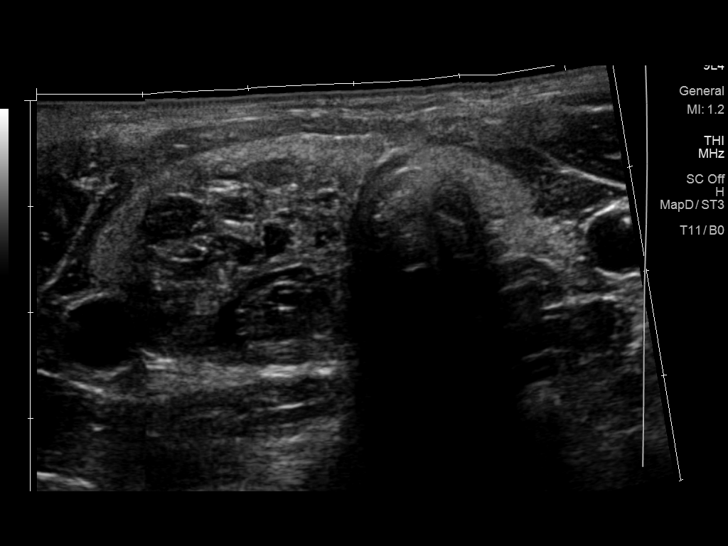
[im 3/33]
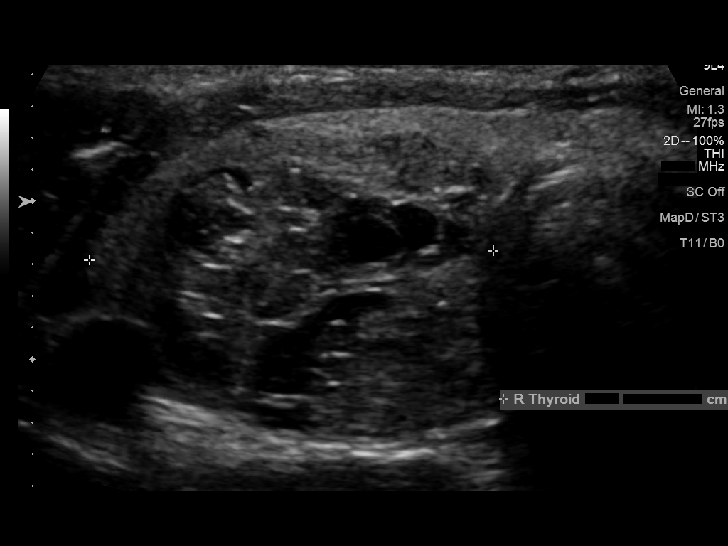
[im 6/33]
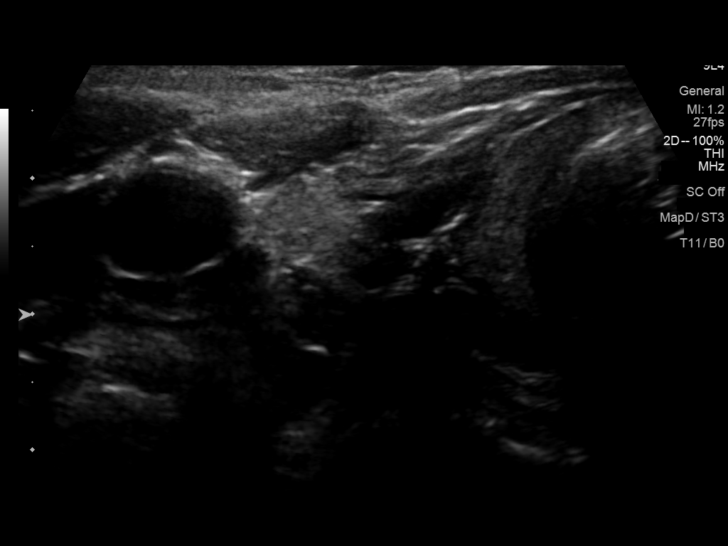
[im 9/33]
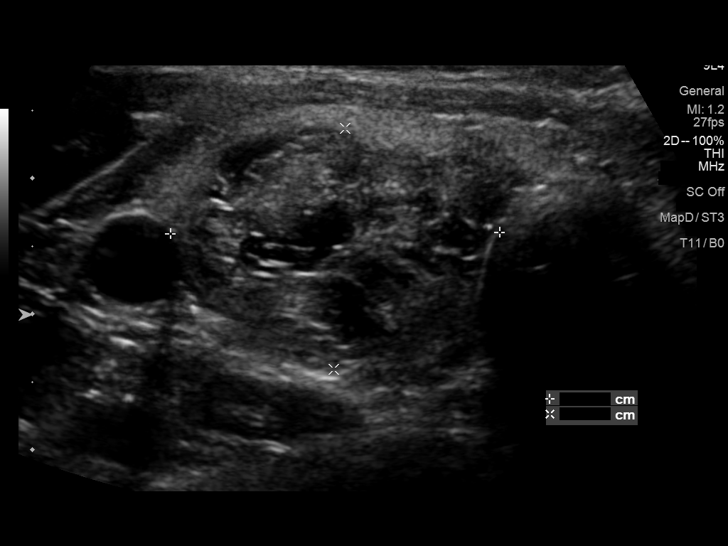
[im 11/33]
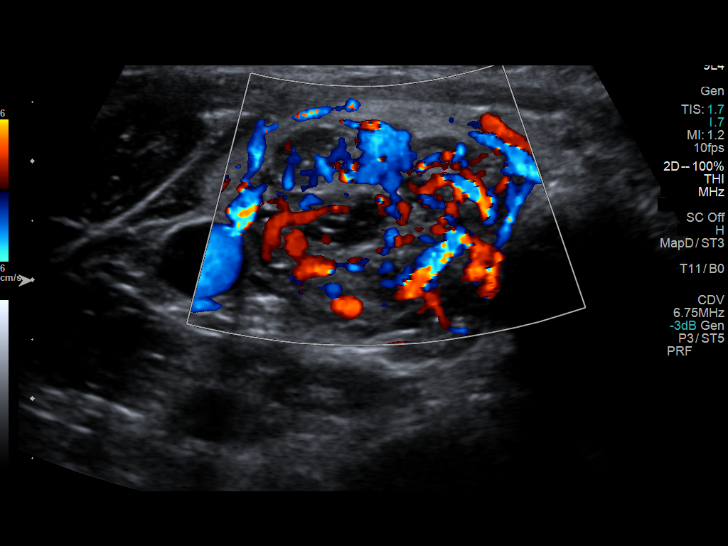
[im 14/33]
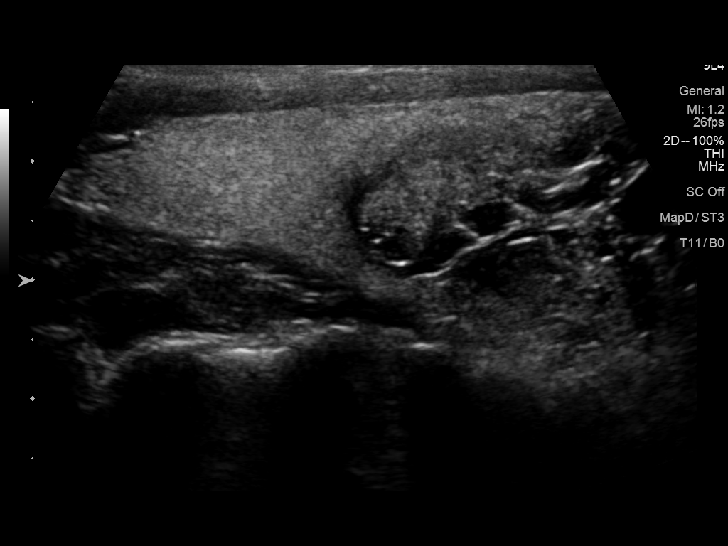
[im 17/33]
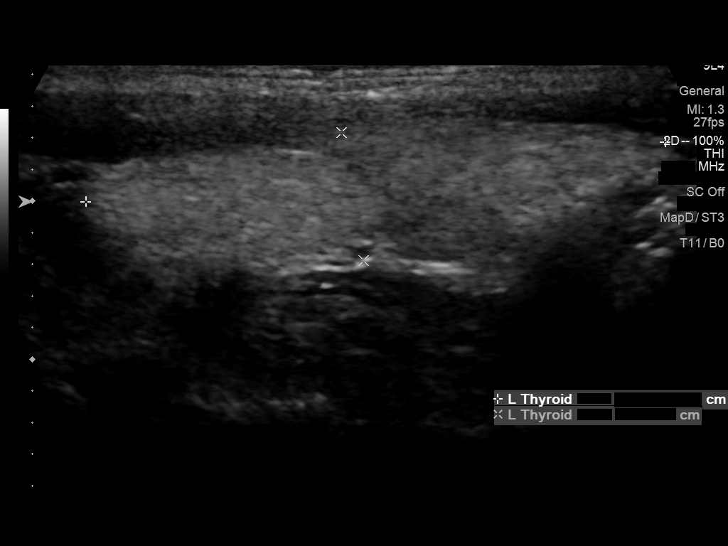
[im 19/33]
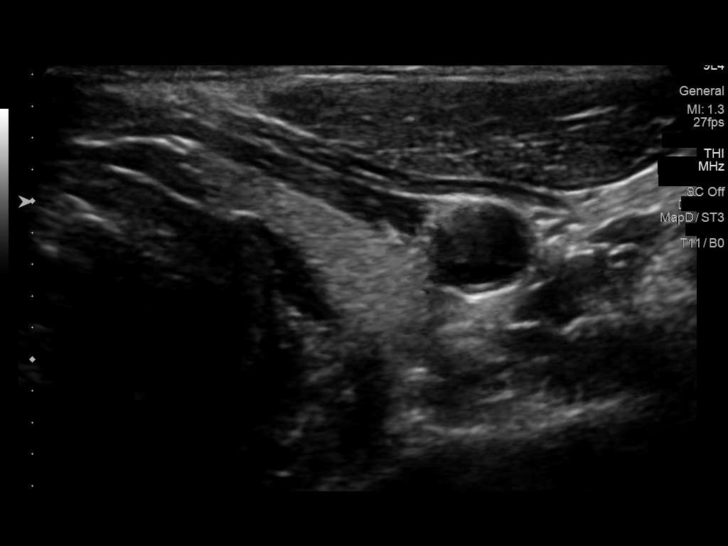
[im 22/33]
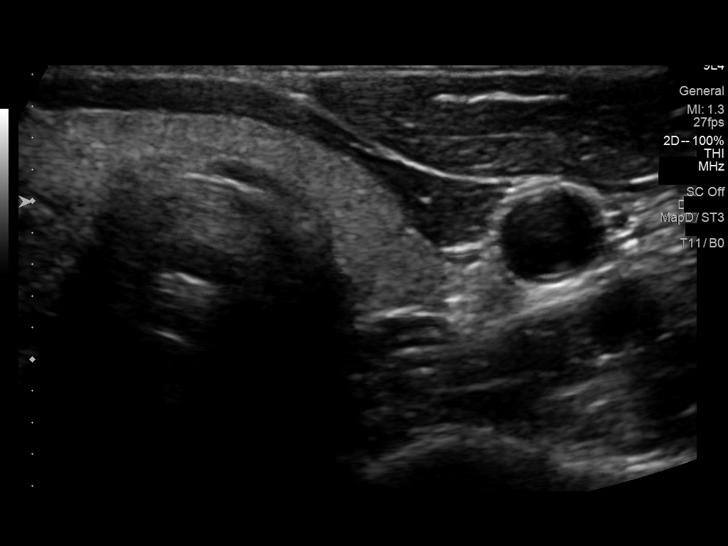
[im 25/33]
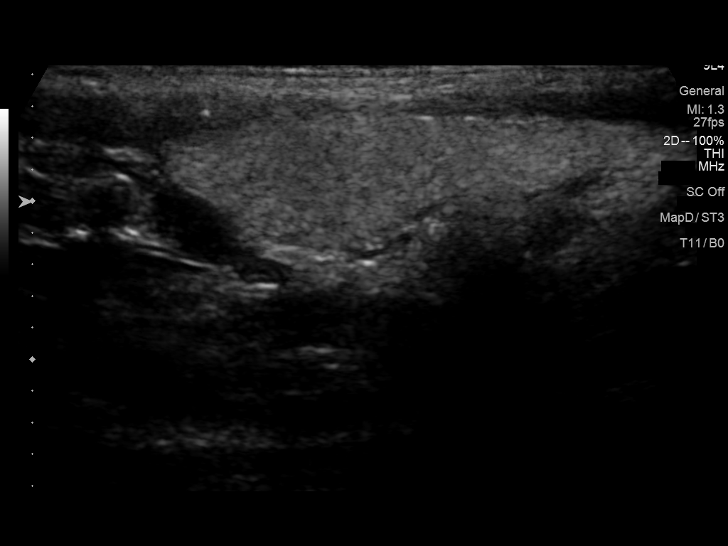
[im 27/33]
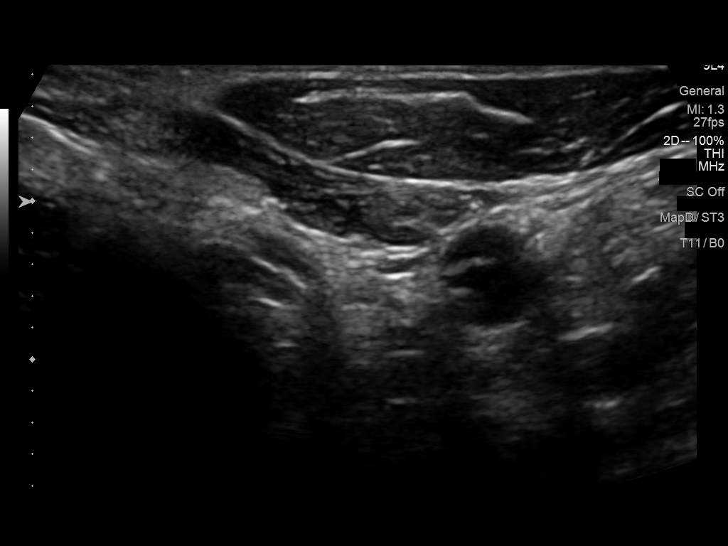
[im 30/33]
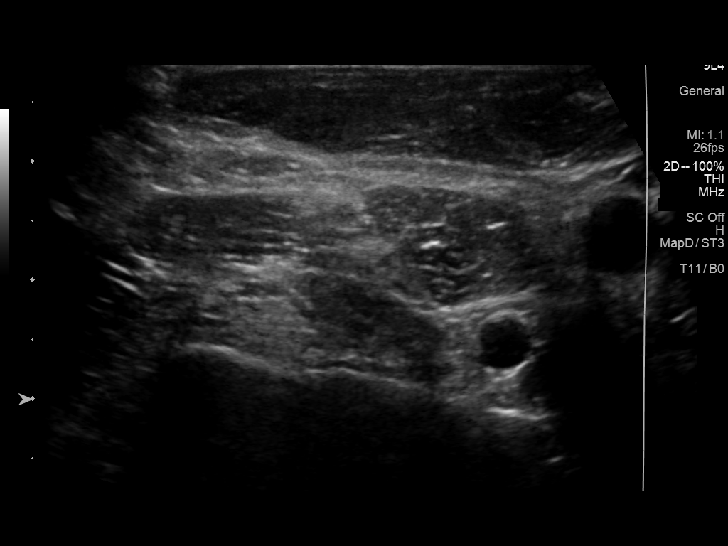
[im 33/33]
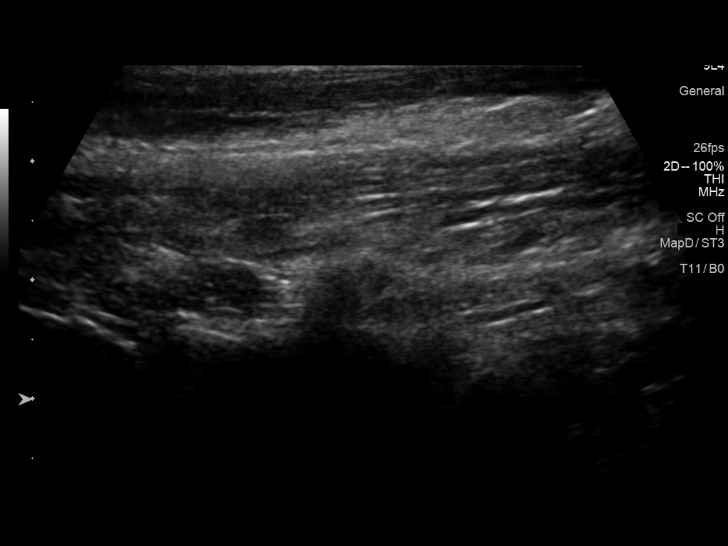

[13 of 25 positions shown; findings below may reference images not displayed]

FINDINGS: Parenchymal Echotexture: Mildly heterogenous

Isthmus: 0.2 cm

Right lobe: 7.6 x 2.8 x 2.6 cm

Left lobe: 4.0 x 0.8 x 1.3 cm

_________________________________________________________

Estimated total number of nodules >/= 1 cm: 1

Number of spongiform nodules >/=  2 cm not described below (TR1): 0

Number of mixed cystic and solid nodules >/= 1.5 cm not described
below (TR2): 0

_________________________________________________________

The previously biopsied mass occupying the majority of the right mid
and lower thyroid gland measures 4.7 x 2.4 x 1.8 cm. The mass is
predominantly solid with multifocal regions of internal cystic
degeneration. Overall, the imaging appearance is unchanged compared
to the prior biopsy images.
IMPRESSION: No significant interval change in the size or appearance of the
previously biopsied mass in the right mid and lower thyroid gland
compared to prior biopsy images.

Given benign prior pathology, no further follow-up is required.

## 2022-09-23 ENCOUNTER — Encounter: Payer: Self-pay | Admitting: Obstetrics

## 2022-09-23 ENCOUNTER — Other Ambulatory Visit (HOSPITAL_COMMUNITY)
Admission: RE | Admit: 2022-09-23 | Discharge: 2022-09-23 | Disposition: A | Discharge status: Home / Self Care | Payer: Medicaid Other | Source: Ambulatory Visit | Attending: Obstetrics | Admitting: Obstetrics

## 2022-09-23 ENCOUNTER — Ambulatory Visit (INDEPENDENT_AMBULATORY_CARE_PROVIDER_SITE_OTHER): Payer: Medicaid Other | Admitting: Obstetrics

## 2022-09-23 VITALS — BP 119/82 | HR 81 | Ht 63.0 in | Wt 123.7 lb

## 2022-09-23 DIAGNOSIS — N898 Other specified noninflammatory disorders of vagina: Secondary | ICD-10-CM

## 2022-09-23 DIAGNOSIS — L738 Other specified follicular disorders: Secondary | ICD-10-CM

## 2022-09-23 DIAGNOSIS — Z113 Encounter for screening for infections with a predominantly sexual mode of transmission: Secondary | ICD-10-CM

## 2022-09-23 MED ORDER — CLINDAMYCIN HCL 300 MG PO CAPS: 300.0000 mg | ORAL_CAPSULE | Freq: Three times a day (TID) | ORAL | 0 refills | Status: DC

## 2022-09-23 MED ORDER — OXYCODONE-ACETAMINOPHEN 5-325 MG PO TABS: 1.0000 | ORAL_TABLET | ORAL | 0 refills | Status: DC | PRN

## 2022-09-23 MED ORDER — IBUPROFEN 800 MG PO TABS: 800.0000 mg | ORAL_TABLET | Freq: Three times a day (TID) | ORAL | 5 refills | Status: DC | PRN

## 2022-09-23 MED ORDER — FLUCONAZOLE 150 MG PO TABS: 150.0000 mg | ORAL_TABLET | Freq: Once | ORAL | 0 refills | Status: AC

## 2022-09-23 NOTE — Progress Notes (Unsigned)
Patient ID: Barbara Gilbert, female   DOB: 1986/06/25, 37 y.o.   MRN: [id number]  No chief complaint on file.   HPI Barbara Gilbert is a 37 y.o. female.  Complains of boil on vagina and vaginal discharge. HPI  Past Medical History:  Diagnosis Date   Anxiety attack    Panic disorder    PTSD (post-traumatic stress disorder)    Held hostage while pregnant as a teen   Seizure ([place]) 03/11/2019   seizure x 1 after fall down stair and loss consciousness; ER visit and observation overnight in hospital   Seizure disorder [facility])    sees Dr. Ellouise Newer    Past Surgical History:  Procedure Laterality Date   abortions     X 2----2012 and 2014   BIOPSY THYROID     1 week ago October 2020; awaiting result   LAPAROSCOPIC TUBAL LIGATION Bilateral 10/04/2017   Procedure: LAPAROSCOPIC TUBAL LIGATION WITH FILSHIE CLIPS;  Surgeon: Woodroe Mode, MD;  Location: [facility];  Service: Gynecology;  Laterality: Bilateral;   SHOULDER ARTHROSCOPY WITH BANKART REPAIR Right 06/05/2019   Procedure: SHOULDER ARTHROSCOPY WITH BANKART REPAIR;  Surgeon: Hiram Gash, MD;  Location: [facility];  Service: Orthopedics;  Laterality: Right;    Family History  Problem Relation Age of Onset   Fibromyalgia Mother    Mental illness Mother    Gout Father    Asthma Sister     Social History Social History   Tobacco Use   Smoking status: Former    Packs/day: 0.40    Types: Cigarettes    Start date: 2009    Quit date: 01/26/2021    Years since quitting: 1.6   Smokeless tobacco: Never  Vaping Use   Vaping Use: Never used  Substance Use Topics   Alcohol use: No   Drug use: Not Currently    Frequency: 4.0 times per week    Types: Marijuana    Comment: 1 week ago    No Known Allergies  Current Outpatient Medications  Medication Sig Dispense Refill   clindamycin (CLEOCIN) 300 MG capsule Take 1 capsule (300 mg total) by mouth 3 (three) times daily. 21 capsule 0   fluconazole (DIFLUCAN) 150 MG tablet  Take 1 tablet (150 mg total) by mouth once for 1 dose. 1 tablet 0   ibuprofen (ADVIL) 800 MG tablet Take 1 tablet (800 mg total) by mouth every 8 (eight) hours as needed. 30 tablet 5   oxyCODONE-acetaminophen (PERCOCET/ROXICET) 5-325 MG tablet Take 1 tablet by mouth every 4 (four) hours as needed for severe pain. 20 tablet 0   cyclobenzaprine (FLEXERIL) 10 MG tablet Take 1 tablet (10 mg total) by mouth at bedtime. 20 tablet 0   gabapentin (NEURONTIN) 300 MG capsule Take 1 capsule (300 mg total) by mouth 3 (three) times daily. 90 capsule 3   ibuprofen (ADVIL) 800 MG tablet Take 1 tablet (800 mg total) by mouth every 8 (eight) hours as needed. 30 tablet 5   metroNIDAZOLE (METROGEL) 0.75 % vaginal gel Place 1 Applicatorful vaginally at bedtime. Apply one applicatorful to vagina at bedtime for 5 days 70 g 1   prazosin (MINIPRESS) 1 MG capsule TAKE 1 CAPSULE BY MOUTH AT BEDTIME. 90 capsule 1   No current facility-administered medications for this visit.    Review of Systems Review of Systems Constitutional: negative for fatigue and weight loss Respiratory: negative for cough and wheezing Cardiovascular: negative for chest pain, fatigue and palpitations Gastrointestinal: negative for abdominal  pain and change in bowel habits Genitourinary: positive for boil on vagina and vaginal discharge Integument/breast: negative for nipple discharge Musculoskeletal:negative for myalgias Neurological: negative for gait problems and tremors Behavioral/Psych: negative for abusive relationship, depression Endocrine: negative for temperature intolerance      Blood pressure 119/82, pulse 81, height 5' 3"$  (1.6 m), weight 123 lb 11.2 oz (56.1 kg), last menstrual period 09/15/2022.  Physical Exam Physical Exam General:   Alert and no distress  Skin:   no rash or abnormalities  Lungs:   clear to auscultation bilaterally  Heart:   regular rate and rhythm, S1, S2 normal, no murmur, click, rub or gallop  Breasts:    Not examined  Abdomen:  normal findings: no organomegaly, soft, non-tender and no hernia  Pelvis:  External genitalia: inflamed hair follicle in shaved pubic area Urinary system: urethral meatus normal and bladder without fullness, nontender Vaginal: normal without tenderness, induration or masses Cervix: not examined Adnexa: not examined Uterus: not examined   I have spent a total of 20 minutes of face-to-face time, excluding clinical staff time, reviewing notes and preparing to see patient, ordering tests and/or medications, and counseling the patient.   Data Reviewed Labs  Assessment     1. Folliculitis barbae Rx: - clindamycin (CLEOCIN) 300 MG capsule; Take 1 capsule (300 mg total) by mouth 3 (three) times daily.  Dispense: 21 capsule; Refill: 0 - ibuprofen (ADVIL) 800 MG tablet; Take 1 tablet (800 mg total) by mouth every 8 (eight) hours as needed.  Dispense: 30 tablet; Refill: 5 - oxyCODONE-acetaminophen (PERCOCET/ROXICET) 5-325 MG tablet; Take 1 tablet by mouth every 4 (four) hours as needed for severe pain.  Dispense: 20 tablet; Refill: 0  2. Vaginal discharge Rx: - Cervicovaginal ancillary only - fluconazole (DIFLUCAN) 150 MG tablet; Take 1 tablet (150 mg total) by mouth once for 1 dose.  Dispense: 1 tablet; Refill: 0  3. Screening examination for sexually transmitted disease Rx: - Hepatitis C antibody - HIV Antibody (routine testing w rflx) - RPR - Hepatitis B surface antigen     Plan   Follow up prn  Orders Placed This Encounter  Procedures   Hepatitis C antibody   HIV Antibody (routine testing w rflx)   RPR   Hepatitis B surface antigen   Meds ordered this encounter  Medications   clindamycin (CLEOCIN) 300 MG capsule    Sig: Take 1 capsule (300 mg total) by mouth 3 (three) times daily.    Dispense:  21 capsule    Refill:  0   fluconazole (DIFLUCAN) 150 MG tablet    Sig: Take 1 tablet (150 mg total) by mouth once for 1 dose.    Dispense:  1 tablet     Refill:  0   ibuprofen (ADVIL) 800 MG tablet    Sig: Take 1 tablet (800 mg total) by mouth every 8 (eight) hours as needed.    Dispense:  30 tablet    Refill:  5   oxyCODONE-acetaminophen (PERCOCET/ROXICET) 5-325 MG tablet    Sig: Take 1 tablet by mouth every 4 (four) hours as needed for severe pain.    Dispense:  20 tablet    Refill:  0     Shelly Bombard, MD 09/23/2022 11:40 AM

## 2022-09-23 NOTE — Progress Notes (Signed)
Pt presents with boil on the left side of her vagina. Pt reports the bump popped up about a month ago. She shaves regularly. Pt requesting STD testing. No other concerns at this time.

## 2022-09-24 LAB — HIV ANTIBODY (ROUTINE TESTING W REFLEX): HIV Screen 4th Generation wRfx: NONREACTIVE

## 2022-09-24 LAB — HEPATITIS C ANTIBODY: Hep C Virus Ab: NONREACTIVE

## 2022-09-24 LAB — HEPATITIS B SURFACE ANTIGEN: Hepatitis B Surface Ag: NEGATIVE

## 2022-09-24 LAB — RPR: RPR Ser Ql: NONREACTIVE

## 2022-09-26 LAB — CERVICOVAGINAL ANCILLARY ONLY
Chlamydia: NEGATIVE
Comment: NEGATIVE
Comment: NEGATIVE
Comment: NORMAL
Neisseria Gonorrhea: NEGATIVE
Trichomonas: NEGATIVE

## 2022-10-27 ENCOUNTER — Ambulatory Visit: Payer: Medicaid Other | Admitting: Obstetrics and Gynecology

## 2022-10-28 ENCOUNTER — Encounter: Payer: Self-pay | Admitting: Obstetrics

## 2022-10-31 ENCOUNTER — Ambulatory Visit (INDEPENDENT_AMBULATORY_CARE_PROVIDER_SITE_OTHER): Payer: Medicaid Other | Admitting: Obstetrics

## 2022-10-31 ENCOUNTER — Encounter: Payer: Self-pay | Admitting: Obstetrics

## 2022-10-31 DIAGNOSIS — L738 Other specified follicular disorders: Secondary | ICD-10-CM

## 2022-10-31 DIAGNOSIS — N764 Abscess of vulva: Secondary | ICD-10-CM

## 2022-10-31 MED ORDER — CLINDAMYCIN HCL 300 MG PO CAPS: 300.0000 mg | ORAL_CAPSULE | Freq: Three times a day (TID) | ORAL | 0 refills | Status: DC

## 2022-10-31 NOTE — Progress Notes (Signed)
Patient ID: Barbara Gilbert, female   DOB: 09-Nov-1985, 37 y.o.   MRN: [id number]  Chief Complaint  Patient presents with   Vaginal Pain    HPI Barbara Gilbert is a 37 y.o. female.  Presents for follow of folliculitis in mons pubis.  No complaints. HPI  Past Medical History:  Diagnosis Date   Anxiety attack    Panic disorder    PTSD (post-traumatic stress disorder)    Held hostage while pregnant as a teen   Seizure ([place]) 03/11/2019   seizure x 1 after fall down stair and loss consciousness; ER visit and observation overnight in hospital   Seizure disorder [facility])    sees Dr. Ellouise Newer    Past Surgical History:  Procedure Laterality Date   abortions     X 2----2012 and 2014   BIOPSY THYROID     1 week ago October 2020; awaiting result   LAPAROSCOPIC TUBAL LIGATION Bilateral 10/04/2017   Procedure: LAPAROSCOPIC TUBAL LIGATION WITH FILSHIE CLIPS;  Surgeon: Woodroe Mode, MD;  Location: [facility];  Service: Gynecology;  Laterality: Bilateral;   SHOULDER ARTHROSCOPY WITH BANKART REPAIR Right 06/05/2019   Procedure: SHOULDER ARTHROSCOPY WITH BANKART REPAIR;  Surgeon: Hiram Gash, MD;  Location: [facility];  Service: Orthopedics;  Laterality: Right;    Family History  Problem Relation Age of Onset   Fibromyalgia Mother    Mental illness Mother    Gout Father    Asthma Sister     Social History Social History   Tobacco Use   Smoking status: Former    Packs/day: .4    Types: Cigarettes    Start date: 2009    Quit date: 01/26/2021    Years since quitting: 1.7   Smokeless tobacco: Never  Vaping Use   Vaping Use: Never used  Substance Use Topics   Alcohol use: No   Drug use: Not Currently    Frequency: 4.0 times per week    Types: Marijuana    Comment: 1 week ago    No Known Allergies  Current Outpatient Medications  Medication Sig Dispense Refill   clindamycin (CLEOCIN) 300 MG capsule Take 1 capsule (300 mg total) by mouth 3 (three) times daily. 21 capsule 0    cyclobenzaprine (FLEXERIL) 10 MG tablet Take 1 tablet (10 mg total) by mouth at bedtime. 20 tablet 0   gabapentin (NEURONTIN) 300 MG capsule Take 1 capsule (300 mg total) by mouth 3 (three) times daily. 90 capsule 3   ibuprofen (ADVIL) 800 MG tablet Take 1 tablet (800 mg total) by mouth every 8 (eight) hours as needed. (Patient not taking: Reported on 10/31/2022) 30 tablet 5   ibuprofen (ADVIL) 800 MG tablet Take 1 tablet (800 mg total) by mouth every 8 (eight) hours as needed. (Patient not taking: Reported on 10/31/2022) 30 tablet 5   metroNIDAZOLE (METROGEL) 0.75 % vaginal gel Place 1 Applicatorful vaginally at bedtime. Apply one applicatorful to vagina at bedtime for 5 days (Patient not taking: Reported on 10/31/2022) 70 g 1   oxyCODONE-acetaminophen (PERCOCET/ROXICET) 5-325 MG tablet Take 1 tablet by mouth every 4 (four) hours as needed for severe pain. (Patient not taking: Reported on 10/31/2022) 20 tablet 0   prazosin (MINIPRESS) 1 MG capsule TAKE 1 CAPSULE BY MOUTH AT BEDTIME. 90 capsule 1   No current facility-administered medications for this visit.    Review of Systems Review of Systems Constitutional: negative for fatigue and weight loss Respiratory: negative for cough and wheezing Cardiovascular:  negative for chest pain, fatigue and palpitations Gastrointestinal: negative for abdominal pain and change in bowel habits Genitourinary:negative Integument/breast: negative for nipple discharge Musculoskeletal:negative for myalgias Neurological: negative for gait problems and tremors Behavioral/Psych: negative for abusive relationship, depression Endocrine: negative for temperature intolerance      Blood pressure 114/70, pulse 60, height 5\' 3"  (1.6 m), weight 120 lb 3.2 oz (54.5 kg), last menstrual period 10/09/2022.  Physical Exam Physical Exam General:   Alert and no distress  Skin:   no rash or abnormalities  Lungs:   clear to auscultation bilaterally  Heart:   regular rate and  rhythm, S1, S2 normal, no murmur, click, rub or gallop  Breasts:   Not examined  Abdomen:  normal findings: no organomegaly, soft, non-tender and no hernia   Pelvis:  External genitalia: normal general appearance Urinary system: urethral meatus normal and bladder without fullness, nontender Vaginal: normal without tenderness, induration or masses Cervix: normal appearance Adnexa: normal bimanual exam Uterus: anteverted and non-tender, normal size    Data Reviewed Labs  I have spent a total of 20 minutes of face-to-face time, excluding clinical staff time, reviewing notes and preparing to see patient, ordering tests and/or medications, and counseling the patient.   Assessment and Plan:    1. Folliculitis barbae, resolving Rx: - clindamycin (CLEOCIN) 300 MG capsule; Take 1 capsule (300 mg total) by mouth 3 (three) times daily.  Dispense: 21 capsule; Refill: 0     Meds ordered this encounter  Medications   clindamycin (CLEOCIN) 300 MG capsule    Sig: Take 1 capsule (300 mg total) by mouth 3 (three) times daily.    Dispense:  21 capsule    Refill:  0    Shelly Bombard, MD 10/31/2022 12:04 PM

## 2022-10-31 NOTE — Progress Notes (Signed)
Pt presents to discuss vaginal boils.  Denies vaginal discharge, irritation

## 2023-02-04 ENCOUNTER — Encounter (HOSPITAL_COMMUNITY): Payer: Self-pay | Admitting: Emergency Medicine

## 2023-02-04 ENCOUNTER — Other Ambulatory Visit: Payer: Self-pay

## 2023-02-04 ENCOUNTER — Emergency Department (HOSPITAL_COMMUNITY)
Admission: EM | Admit: 2023-02-04 | Discharge: 2023-02-04 | Disposition: A | Discharge status: Home / Self Care | Payer: Medicaid Other | Attending: Emergency Medicine | Admitting: Emergency Medicine

## 2023-02-04 DIAGNOSIS — Z711 Person with feared health complaint in whom no diagnosis is made: Secondary | ICD-10-CM | POA: Diagnosis not present

## 2023-02-04 NOTE — ED Triage Notes (Signed)
Pt to ED from home c/o possible tampon stuck in vagina.  States started menstrual cycle yesterday but denies heavier than normal bleeding, discharge, pain, or foul odors.

## 2023-02-04 NOTE — ED Provider Notes (Signed)
[facility] EMERGENCY DEPARTMENT AT [facility] Provider Note   CSN: [id number] Arrival date & time: 02/04/23  0542     History  Chief Complaint  Patient presents with   Foreign Body in Vagina    Barbara Gilbert is a 37 y.o. female who presents to the ED for the concern of retained foreign body. Patient is unsure if she took her tampon out last night. She could not feel a string and is here to make sure that there is not a retained tampon. She denies abdominal pain, vaginal pain or discharge, or bleeding since last night. No other complaints or concerns at this time.    Home Medications Prior to Admission medications   Medication Sig Start Date End Date Taking? Authorizing Provider  clindamycin (CLEOCIN) 300 MG capsule Take 1 capsule (300 mg total) by mouth 3 (three) times daily. 10/31/22   Brock Bad, MD  cyclobenzaprine (FLEXERIL) 10 MG tablet Take 1 tablet (10 mg total) by mouth at bedtime. 02/21/21   Haskel Schroeder, PA-C  gabapentin (NEURONTIN) 300 MG capsule Take 1 capsule (300 mg total) by mouth 3 (three) times daily. 04/28/21   Shanna Cisco, NP  ibuprofen (ADVIL) 800 MG tablet Take 1 tablet (800 mg total) by mouth every 8 (eight) hours as needed. Patient not taking: Reported on 10/31/2022 02/09/21   Brock Bad, MD  ibuprofen (ADVIL) 800 MG tablet Take 1 tablet (800 mg total) by mouth every 8 (eight) hours as needed. Patient not taking: Reported on 10/31/2022 09/23/22   Brock Bad, MD  metroNIDAZOLE (METROGEL) 0.75 % vaginal gel Place 1 Applicatorful vaginally at bedtime. Apply one applicatorful to vagina at bedtime for 5 days Patient not taking: Reported on 10/31/2022 05/19/22   Constant, Peggy, MD  oxyCODONE-acetaminophen (PERCOCET/ROXICET) 5-325 MG tablet Take 1 tablet by mouth every 4 (four) hours as needed for severe pain. Patient not taking: Reported on 10/31/2022 09/23/22   Brock Bad, MD  prazosin (MINIPRESS) 1 MG capsule TAKE 1 CAPSULE  BY MOUTH AT BEDTIME. 05/24/21   Shanna Cisco, NP      Allergies    Patient has no known allergies.    Review of Systems   Review of Systems  Genitourinary:        Possible tampon retained in vagina  All other systems reviewed and are negative.   Physical Exam Updated Vital Signs BP 112/87 (BP Location: Left Arm)   Pulse 68   Temp 98.3 F (36.8 C) (Oral)   Resp 16   Ht 5\' 3"  (1.6 m)   Wt 54.4 kg   LMP 02/03/2023 (Exact Date)   SpO2 99%   BMI 21.26 kg/m  Physical Exam Vitals and nursing note reviewed.  Constitutional:      Appearance: Normal appearance.  HENT:     Head: Normocephalic and atraumatic.     Mouth/Throat:     Mouth: Mucous membranes are moist.  Eyes:     Conjunctiva/sclera: Conjunctivae normal.     Pupils: Pupils are equal, round, and reactive to light.  Cardiovascular:     Rate and Rhythm: Normal rate and regular rhythm.     Pulses: Normal pulses.     Heart sounds: Normal heart sounds.  Pulmonary:     Effort: Pulmonary effort is normal.     Breath sounds: Normal breath sounds.  Abdominal:     Palpations: Abdomen is soft.     Tenderness: There is no abdominal tenderness.  Skin:  General: Skin is warm and dry.     Findings: No rash.  Neurological:     General: No focal deficit present.     Mental Status: She is alert.  Psychiatric:        Mood and Affect: Mood normal.        Behavior: Behavior normal.     ED Results / Procedures / Treatments   Labs (all labs ordered are listed, but only abnormal results are displayed) Labs Reviewed - No data to display  EKG None  Radiology No results found.  Procedures Procedures: not indicated.   Medications Ordered in ED Medications - No data to display  ED Course/ Medical Decision Making/ A&P                             Medical Decision Making  Patient is a 37 year old female who presents to the ED today for possible retained tampon. She denies fever, abdominal or vaginal pain,  vaginal discharge, or bleeding. My differential include: retained foreign body in vagina vs no retained foreign body.  Chaperone was present with the pelvic exam. No erythema or tampon string present at the cervix. No blood in vaginal vault. No tenderness or foreign bodies present to palpation of the uterus.  Patient is stable and safe for discharge home. Strict return precautions given.       Final Clinical Impression(s) / ED Diagnoses Final diagnoses:  Feared complaint without diagnosis    Rx / DC Orders ED Discharge Orders     None         Maxwell Marion, PA-C 02/04/23 2137    Derwood Kaplan, MD 02/05/23 1152

## 2023-02-04 NOTE — Discharge Instructions (Signed)
No foreign body was visualized or felt on exam.   Follow up with your PCP for reevaluation in 48 hours as needed.  Return to ED if: You have a lot of blood or fluid coming from your vagina. You have very bad belly pain.

## 2023-02-22 ENCOUNTER — Other Ambulatory Visit: Payer: Self-pay | Admitting: Student

## 2023-02-22 ENCOUNTER — Ambulatory Visit (INDEPENDENT_AMBULATORY_CARE_PROVIDER_SITE_OTHER): Payer: Medicaid Other | Admitting: Student

## 2023-02-22 ENCOUNTER — Other Ambulatory Visit (HOSPITAL_COMMUNITY)
Admission: RE | Admit: 2023-02-22 | Discharge: 2023-02-22 | Disposition: A | Discharge status: Home / Self Care | Payer: Medicaid Other | Source: Ambulatory Visit | Attending: Student | Admitting: Student

## 2023-02-22 VITALS — BP 110/73 | HR 68 | Wt 128.8 lb

## 2023-02-22 DIAGNOSIS — L738 Other specified follicular disorders: Secondary | ICD-10-CM

## 2023-02-22 DIAGNOSIS — Z113 Encounter for screening for infections with a predominantly sexual mode of transmission: Secondary | ICD-10-CM

## 2023-02-22 NOTE — Progress Notes (Signed)
Pt reports recurrent ingrown hair that is painful, and she also requests std panel today.

## 2023-02-22 NOTE — Progress Notes (Signed)
  History:  [name] is a 37 y.o. [id number] who presents to clinic today for follow-up of painful in-grown hair on her vagina.  Received antibiotic treatment in March.   The following portions of the patient's history were reviewed and updated as appropriate: allergies, current medications, family history, past medical history, social history, past surgical history and problem list.  Review of Systems:  Review of Systems  Genitourinary:        Bump on vagina  All other systems reviewed and are negative.     Objective:  Physical Exam LMP 02/03/2023 (Exact Date)  Physical Exam Vitals and nursing note reviewed. Exam conducted with a chaperone present.  Constitutional:      Appearance: Normal appearance.  Cardiovascular:     Rate and Rhythm: Normal rate.  Pulmonary:     Effort: Pulmonary effort is normal.  Genitourinary:    General: Normal vulva.       Comments: Hyperpigmented pinpoint hardened follicular bump with tenderness noted on exam, no tunneling, discharge, or redness  present Musculoskeletal:        General: Normal range of motion.  Skin:    General: Skin is warm and dry.  Neurological:     Mental Status: She is alert and oriented to person, place, and time. Mental status is at baseline.  Psychiatric:        Mood and Affect: Mood normal.        Behavior: Behavior normal.       Labs and Imaging No results found for this or any previous visit (from the past 24 hour(s)).  No results found.  Health Maintenance Due  Topic Date Due   COVID-19 Vaccine (1 - 2023-24 season) Never done    Labs, imaging and previous visits in Epic and Care Everywhere reviewed  Assessment & Plan:  1. Screening examination for sexually transmitted disease - HIV antibody (with reflex) - RPR - Hepatitis B Surface AntiGEN - Hepatitis C Antibody - Cervicovaginal ancillary only( [place])  2. Folliculitis barbae - triamcinolone ointment (KENALOG) 0.5 %; Apply 1 Application  topically 2 (two) times daily.  Dispense: 30 g; Refill: 0 - clindamycin-benzoyl peroxide (BENZACLIN) gel; Apply topically 2 (two) times daily.  Dispense: 25 g; Refill: 0 - tretinoin (RETIN-A) 0.025 % gel; Apply topically at bedtime.  Dispense: 45 g; Refill: 0    Approximately 20 minutes of total time was spent with this patient on counseling and coordination of care  No follow-ups on file.  Corlis Hove, NP 02/22/2023 11:24 AM

## 2023-02-23 ENCOUNTER — Telehealth: Payer: Self-pay

## 2023-02-23 LAB — HEPATITIS C ANTIBODY: Hep C Virus Ab: NONREACTIVE

## 2023-02-23 LAB — RPR: RPR Ser Ql: NONREACTIVE

## 2023-02-23 LAB — HEPATITIS B SURFACE ANTIGEN: Hepatitis B Surface Ag: NEGATIVE

## 2023-02-23 LAB — HIV ANTIBODY (ROUTINE TESTING W REFLEX): HIV Screen 4th Generation wRfx: NONREACTIVE

## 2023-02-23 NOTE — Telephone Encounter (Signed)
Pt was seen in the office yesterday (see note), pt is calling stating no meds have been sent in yet. Advised pt meds will be sent shortly. Pt voices understanding.

## 2023-02-24 LAB — CERVICOVAGINAL ANCILLARY ONLY
Bacterial Vaginitis (gardnerella): POSITIVE — AB
Candida Glabrata: NEGATIVE
Candida Vaginitis: NEGATIVE
Chlamydia: NEGATIVE
Comment: NEGATIVE
Comment: NEGATIVE
Comment: NEGATIVE
Comment: NEGATIVE
Comment: NEGATIVE
Comment: NORMAL
Neisseria Gonorrhea: NEGATIVE
Trichomonas: NEGATIVE

## 2023-02-24 MED ORDER — TRETINOIN 0.025 % EX GEL
Freq: Every day | CUTANEOUS | 0 refills | Status: DC
Start: 2023-02-24 — End: 2024-04-18

## 2023-02-24 MED ORDER — TRIAMCINOLONE ACETONIDE 0.5 % EX OINT: 1.0000 | TOPICAL_OINTMENT | Freq: Two times a day (BID) | CUTANEOUS | 0 refills | Status: DC

## 2023-02-24 MED ORDER — CLINDAMYCIN PHOS-BENZOYL PEROX 1-5 % EX GEL: Freq: Two times a day (BID) | CUTANEOUS | 0 refills | Status: DC

## 2023-02-25 ENCOUNTER — Other Ambulatory Visit: Payer: Self-pay | Admitting: Student

## 2023-02-25 DIAGNOSIS — L738 Other specified follicular disorders: Secondary | ICD-10-CM

## 2023-02-28 ENCOUNTER — Other Ambulatory Visit: Payer: Self-pay | Admitting: Student

## 2023-02-28 DIAGNOSIS — B9689 Other specified bacterial agents as the cause of diseases classified elsewhere: Secondary | ICD-10-CM

## 2023-02-28 MED ORDER — METRONIDAZOLE 0.75 % VA GEL: 1.0000 | Freq: Every day | VAGINAL | 1 refills | Status: DC

## 2023-04-08 ENCOUNTER — Emergency Department (HOSPITAL_COMMUNITY)
Admission: EM | Admit: 2023-04-08 | Discharge: 2023-04-08 | Disposition: A | Discharge status: Home / Self Care | Payer: Medicaid Other | Attending: Emergency Medicine | Admitting: Emergency Medicine

## 2023-04-08 ENCOUNTER — Encounter (HOSPITAL_COMMUNITY): Payer: Self-pay

## 2023-04-08 ENCOUNTER — Other Ambulatory Visit: Payer: Self-pay

## 2023-04-08 DIAGNOSIS — K047 Periapical abscess without sinus: Secondary | ICD-10-CM | POA: Insufficient documentation

## 2023-04-08 MED ORDER — KETOROLAC TROMETHAMINE 60 MG/2ML IM SOLN
30.0000 mg | Freq: Once | INTRAMUSCULAR | Status: AC
  Administered 2023-04-08: 30 mg via INTRAMUSCULAR
  Filled 2023-04-08: qty 2

## 2023-04-08 MED ORDER — NAPROXEN 375 MG PO TABS: 375.0000 mg | ORAL_TABLET | Freq: Two times a day (BID) | ORAL | 0 refills | Status: DC

## 2023-04-08 MED ORDER — PROCHLORPERAZINE EDISYLATE 10 MG/2ML IJ SOLN
10.0000 mg | Freq: Once | INTRAMUSCULAR | Status: AC
  Administered 2023-04-08: 10 mg via INTRAMUSCULAR
  Filled 2023-04-08: qty 2

## 2023-04-08 MED ORDER — BUPIVACAINE-EPINEPHRINE (PF) 0.5% -1:200000 IJ SOLN
1.8000 mL | Freq: Once | INTRAMUSCULAR | Status: AC
  Administered 2023-04-08: 1.8 mL
  Filled 2023-04-08: qty 1.8

## 2023-04-08 NOTE — ED Provider Notes (Signed)
[place] EMERGENCY DEPARTMENT AT [facility] Provider Note  CSN: [id number] Arrival date & time: 04/08/23 8413  Chief Complaint(s) Dental Pain  HPI Barbara Gilbert is a 37 y.o. female    The history is provided by the patient.  Dental Pain Location:  Lower Quality:  Constant Severity:  Severe Onset quality:  Gradual Duration:  1 week Timing:  Constant Progression:  Worsening Chronicity:  New Context: abscess   Context comment:  On Amox since Tues Relieved by:  Nothing Worsened by:  Touching, jaw movement and pressure Associated symptoms: facial pain   Associated symptoms: no difficulty swallowing, no facial swelling, no fever and no trismus     Past Medical History Past Medical History:  Diagnosis Date   Anxiety attack    Panic disorder    PTSD (post-traumatic stress disorder)    Held hostage while pregnant as a teen   Seizure (HCC) 03/11/2019   seizure x 1 after fall down stair and loss consciousness; ER visit and observation overnight in hospital   Seizure disorder [facility])    sees Dr. 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 Authorizing Provider  acetaminophen (TYLENOL) 500 MG tablet Take 500 mg by mouth every 6 (six) hours as needed.   Yes [provider]  amoxicillin (AMOXIL) 500 MG capsule Take 500 mg by mouth every 8 (eight) hours. 04/05/23  Yes [provider]  ibuprofen (ADVIL) 800 MG tablet Take 1 tablet (800 mg total) by mouth every 8 (eight) hours as needed. 09/23/22  Yes Brock Bad, MD  naproxen (NAPROSYN) 375 MG tablet Take 1 tablet (375 mg total) by mouth 2 (two) times daily. 04/08/23  Yes Eladia Frame, Amadeo Garnet, MD  naproxen sodium (ALEVE) 220 MG tablet Take 220 mg by mouth daily as needed (pain).   Yes [provider]  clindamycin (CLEOCIN) 300 MG  capsule Take 1 capsule (300 mg total) by mouth 3 (three) times daily. Patient not taking: Reported on 02/22/2023 10/31/22   Brock Bad, MD  clindamycin-benzoyl peroxide [facility]) gel Apply topically 2 (two) times daily. Patient not taking: Reported on 04/08/2023 02/24/23   Corlis Hove, NP  cyclobenzaprine (FLEXERIL) 10 MG tablet Take 1 tablet (10 mg total) by mouth at bedtime. 02/21/21   Haskel Schroeder, PA-C  gabapentin (NEURONTIN) 300 MG capsule Take 1 capsule (300 mg total) by mouth 3 (three) times daily. 04/28/21   Shanna Cisco, NP  ibuprofen (ADVIL) 800 MG tablet Take 1 tablet (800 mg total) by mouth every 8 (eight) hours as needed. Patient not taking: Reported on 04/08/2023 02/09/21   Brock Bad, MD  metroNIDAZOLE (METROGEL) 0.75 % vaginal gel Place 1 Applicatorful vaginally at bedtime. Apply one applicatorful to vagina at bedtime for 5 days Patient not taking: Reported on 04/08/2023 02/28/23   Corlis Hove, NP  oxyCODONE-acetaminophen (PERCOCET/ROXICET) 5-325 MG tablet Take 1 tablet by mouth every 4 (four) hours as needed for severe pain. Patient not taking: Reported on 10/31/2022 09/23/22   Brock Bad, MD  prazosin (MINIPRESS) 1 MG capsule TAKE 1 CAPSULE BY MOUTH AT BEDTIME. 05/24/21   Toy Cookey E, NP  tretinoin (RETIN-A) 0.025 % gel Apply topically at bedtime. Patient not taking: Reported on 04/08/2023 02/24/23   Corlis Hove, NP  triamcinolone ointment (KENALOG) 0.5 % Apply 1 Application topically  2 (two) times daily. Patient not taking: Reported on 04/08/2023 02/24/23   Corlis Hove, NP                                                                                                                                    Allergies Patient has no known allergies.  Review of Systems Review of Systems  Constitutional:  Negative for fever.  HENT:  Negative for facial swelling.    As noted in HPI  Physical Exam Vital Signs  I have reviewed the triage  vital signs BP 121/65   Pulse 80   Temp 98 F (36.7 C) (Oral)   Resp 16   Ht 5\' 3"  (1.6 m)   Wt 55.8 kg   SpO2 99%   BMI 21.79 kg/m   Physical Exam Vitals reviewed.  Constitutional:      General: She is not in acute distress.    Appearance: She is well-developed. She is not diaphoretic.  HENT:     Head: Normocephalic and atraumatic.     Jaw: Tenderness present. No trismus, swelling, pain on movement or malocclusion.     Right Ear: External ear normal.     Left Ear: External ear normal.     Nose: Nose normal.     Mouth/Throat:     Mouth: No oral lesions.     Dentition: Dental tenderness present. No gingival swelling or gum lesions.   Eyes:     General: No scleral icterus.    Conjunctiva/sclera: Conjunctivae normal.  Neck:     Trachea: Phonation normal.  Cardiovascular:     Rate and Rhythm: Normal rate and regular rhythm.  Pulmonary:     Effort: Pulmonary effort is normal. No respiratory distress.     Breath sounds: No stridor.  Abdominal:     General: There is no distension.  Musculoskeletal:        General: Normal range of motion.     Cervical back: Normal range of motion.  Neurological:     Mental Status: She is alert and oriented to person, place, and time.  Psychiatric:        Behavior: Behavior normal.     ED Results and Treatments Labs (all labs ordered are listed, but only abnormal results are displayed) Labs Reviewed - No data to display  EKG  EKG Interpretation Date/Time:    Ventricular Rate:    PR Interval:    QRS Duration:    QT Interval:    QTC Calculation:   R Axis:      Text Interpretation:         Radiology No results found.  Medications Ordered in ED Medications  bupivacaine-epinephrine (PF) (MARCAINE W/ EPI) 0.5% -1:200000 injection 1.8 mL (1.8 mLs Infiltration Given 04/08/23 0349)  prochlorperazine (COMPAZINE)  injection 10 mg (10 mg Intramuscular Given 04/08/23 0438)  ketorolac (TORADOL) injection 30 mg (30 mg Intramuscular Given 04/08/23 0604)   Procedures Dental Block  Date/Time: 04/08/2023 6:09 PM  Performed by: Nira Conn, MD Authorized by: Nira Conn, MD   Consent:    Consent obtained:  Verbal   Risks discussed:  Allergic reaction, infection, intravascular injection, pain and unsuccessful block Universal protocol:    Immediately prior to procedure, a time out was called: yes     Patient identity confirmed:  Arm band Indications:    Indications: dental pain   Location:    Block type:  Inferior alveolar   Laterality:  Right Procedure details:    Needle gauge:  27 G   Anesthetic injected:  Bupivacaine 0.5% WITH epi   Injection procedure:  Anatomic landmarks identified, introduced needle, incremental injection, negative aspiration for blood and anatomic landmarks palpated Post-procedure details:    Outcome:  Pain improved   Procedure completion:  Tolerated   (including critical care time) Medical Decision Making / ED Course   Medical Decision Making Risk Prescription drug management.    Here for dental pain and TMJ pain On exam, no obvious abscess noted.  Patient does have tenderness to palpation of the right lower molar.  Already on antibiotics.  Dental block performed as above. No evidence of additional deep tissue infection such as Ludwick's angina requiring imaging at this time. 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 IM Toradol and Compazine given.    Final Clinical Impression(s) / ED Diagnoses Final diagnoses:  Dental infection   The patient appears reasonably screened and/or stabilized for discharge and I doubt any other medical condition or other [facility] requiring further screening, evaluation, or treatment in the ED at this time. I have discussed the findings, Dx and Tx plan with the patient/family who expressed understanding  and agree(s) with the plan. Discharge instructions discussed at length. The patient/family was given strict return precautions who verbalized understanding of the instructions. No further questions at time of discharge.  Disposition: Discharge  Condition: Good  ED Discharge Orders          Ordered    naproxen (NAPROSYN) 375 MG tablet  2 times daily        04/08/23 0532             Follow Up: Fleet Contras, MD [address] [place] [address] [phone]  Call  to schedule an appointment for close follow up  Dentist       This chart was dictated using voice recognition software.  Despite best efforts to proofread,  errors can occur which can change the documentation meaning.    Nira Conn, MD 04/08/23 [id number]

## 2023-04-08 NOTE — ED Notes (Signed)
ED Provider at bedside. 

## 2023-04-08 NOTE — ED Triage Notes (Signed)
Pt presents via EMS c/o right sided facial pain. Reports has a dental abscess and was scheduled for a root canal however was unable to have completed due to financial constraints. Reports currently on Amoxicillin for  the past 3 days.

## 2023-04-08 NOTE — ED Notes (Signed)
Pt states that "I need something other than naproxen for pain. You all have not helped me. I guess I will go to another hospital" This RN explained that pt needs to follow up with a dental specialist, continued taking antibiotics, and use rx for pain management. Pt argumentative, cursing at this RN and ambulated out of department independently.

## 2023-04-08 NOTE — ED Triage Notes (Signed)
Reports took 800mg  PO Motrin at 0200

## 2023-05-04 ENCOUNTER — Other Ambulatory Visit: Payer: Self-pay

## 2023-05-04 DIAGNOSIS — B379 Candidiasis, unspecified: Secondary | ICD-10-CM

## 2023-05-04 MED ORDER — FLUCONAZOLE 150 MG PO TABS
150.0000 mg | ORAL_TABLET | Freq: Once | ORAL | 0 refills | Status: AC
Start: 2023-05-04 — End: 2023-05-04

## 2023-06-07 ENCOUNTER — Telehealth: Payer: Self-pay

## 2023-06-07 NOTE — Telephone Encounter (Signed)
Patient called and left message on triage vm stating that she is having UTI sx and requesting rx to be sent to the pharmacy. Patient stated that she is out of town and will need rx sent to a pharmacy near her.  Patient did not leave pharmacy location on vm.  Returned patient. No answer. Left vm for patient to return call to the office

## 2023-12-22 ENCOUNTER — Telehealth: Payer: Self-pay

## 2023-12-22 NOTE — Telephone Encounter (Signed)
 Returned call, no answer, left vm

## 2023-12-27 ENCOUNTER — Other Ambulatory Visit (HOSPITAL_COMMUNITY)
Admission: RE | Admit: 2023-12-27 | Discharge: 2023-12-27 | Disposition: A | Discharge status: Home / Self Care | Source: Ambulatory Visit | Attending: Obstetrics and Gynecology | Admitting: Obstetrics and Gynecology

## 2023-12-27 ENCOUNTER — Ambulatory Visit: Admitting: Obstetrics and Gynecology

## 2023-12-27 ENCOUNTER — Encounter: Payer: Self-pay | Admitting: Obstetrics and Gynecology

## 2023-12-27 VITALS — BP 102/66 | HR 66 | Ht 63.0 in | Wt 139.0 lb

## 2023-12-27 DIAGNOSIS — N898 Other specified noninflammatory disorders of vagina: Secondary | ICD-10-CM

## 2023-12-27 DIAGNOSIS — Z113 Encounter for screening for infections with a predominantly sexual mode of transmission: Secondary | ICD-10-CM

## 2023-12-27 DIAGNOSIS — R102 Pelvic and perineal pain: Secondary | ICD-10-CM

## 2023-12-27 LAB — POCT URINALYSIS DIPSTICK
Bilirubin, UA: NEGATIVE
Blood, UA: NEGATIVE
Glucose, UA: NEGATIVE
Ketones, UA: NEGATIVE
Leukocytes, UA: NEGATIVE
Nitrite, UA: NEGATIVE
Protein, UA: NEGATIVE
Spec Grav, UA: 1.015 (ref 1.010–1.025)
Urobilinogen, UA: 0.2 U/dL
pH, UA: 5 (ref 5.0–8.0)

## 2023-12-27 LAB — POCT URINE PREGNANCY: Preg Test, Ur: NEGATIVE

## 2023-12-27 MED ORDER — FLUCONAZOLE 150 MG PO TABS: 150.0000 mg | ORAL_TABLET | Freq: Once | ORAL | 0 refills | Status: AC

## 2023-12-27 MED ORDER — METRONIDAZOLE 500 MG PO TABS: 500.0000 mg | ORAL_TABLET | Freq: Two times a day (BID) | ORAL | 0 refills | Status: DC

## 2023-12-27 NOTE — Progress Notes (Signed)
 39 y.o. GYN presents for STD Screening.  C/o vaginal odor, irritation.

## 2023-12-27 NOTE — Progress Notes (Signed)
   ESTABLISHED GYNECOLOGY VISIT Chief Complaint  Patient presents with   Vaginal Discharge    Subjective:  Barbara Gilbert is a 38 y.o. [id number] presenting for vaginal discharge.  Reports she is prone to vaginosis and thinks she has that currently due to discharge with smell Request STI testing Also notes pelvic pain on/off for the past 2 years. Hx tubal ligation with clips and unsure if this could be the reason. Also notes her sister has fibroids. She is unsure if it is gyn in nature but wants to exclude this before exploring other causes   Review of Systems:   Pertinent items are noted in HPI  Pertinent History Reviewed:  Reviewed past medical,surgical, social and family history.  Reviewed problem list, medications and allergies.  Objective:   Vitals:   12/27/23 1343  BP: 102/66  Pulse: 66  Weight: 139 lb (63 kg)  Height: 5\' 3"  (1.6 m)   Physical Examination:   General appearance - well appearing, and in no distress  Mental status - alert, oriented to person, place, and time  Psych:  normal mood and affect  Skin - warm and dry, normal color, no suspicious lesions noted  Abdomen - soft, nontender, nondistended, no masses or organomegaly  Pelvic -  VULVA: normal appearing vulva with no masses, tenderness or lesions   VAGINA: normal appearing vagina with normal color and discharge, no lesions   CERVIX: normal appearing cervix without discharge or lesions, no CMT   UTERUS: uterus is felt to be normal size, shape, consistency and nontender    ADNEXA: No adnexal masses or tenderness noted.  Extremities:  No swelling or varicosities noted  Chaperone present for exam  Assessment and Plan:  1. Vaginal discharge (Primary) Scant discharge on exam. Swab done - Cervicovaginal ancillary only( [place])  2. Routine screening for STI (sexually transmitted infection) - Cervicovaginal ancillary only( [place]) - HIV antibody (with reflex) - Hepatitis C Antibody - RPR -  Hepatitis B Surface AntiGEN  3. Pelvic pain UA and UPT negative. Swabs as above. Will get ultrasound. If normal, consider workup for other etiologies - US  PELVIC COMPLETE WITH TRANSVAGINAL; Future   No follow-ups on file.  No future appointments.  Marci Setter, MD, FACOG Obstetrician & Gynecologist, [facility] for [facility], [facility]

## 2023-12-28 ENCOUNTER — Ambulatory Visit: Payer: Self-pay | Admitting: Obstetrics and Gynecology

## 2023-12-28 LAB — CERVICOVAGINAL ANCILLARY ONLY
Bacterial Vaginitis (gardnerella): POSITIVE — AB
Candida Glabrata: NEGATIVE
Candida Vaginitis: NEGATIVE
Chlamydia: NEGATIVE
Comment: NEGATIVE
Comment: NEGATIVE
Comment: NEGATIVE
Comment: NEGATIVE
Comment: NEGATIVE
Comment: NORMAL
Neisseria Gonorrhea: NEGATIVE
Trichomonas: NEGATIVE

## 2023-12-28 LAB — RPR: RPR Ser Ql: NONREACTIVE

## 2023-12-28 LAB — HEPATITIS B SURFACE ANTIGEN: Hepatitis B Surface Ag: NEGATIVE

## 2023-12-28 LAB — HIV ANTIBODY (ROUTINE TESTING W REFLEX): HIV Screen 4th Generation wRfx: NONREACTIVE

## 2023-12-28 LAB — HEPATITIS C ANTIBODY: Hep C Virus Ab: NONREACTIVE

## 2023-12-29 ENCOUNTER — Ambulatory Visit (HOSPITAL_COMMUNITY): Admission: RE | Admit: 2023-12-29 | Source: Ambulatory Visit

## 2023-12-29 ENCOUNTER — Other Ambulatory Visit: Payer: Self-pay | Admitting: Obstetrics and Gynecology

## 2023-12-29 DIAGNOSIS — B9689 Other specified bacterial agents as the cause of diseases classified elsewhere: Secondary | ICD-10-CM

## 2024-01-18 ENCOUNTER — Other Ambulatory Visit: Payer: Self-pay

## 2024-01-18 DIAGNOSIS — B379 Candidiasis, unspecified: Secondary | ICD-10-CM

## 2024-01-18 MED ORDER — FLUCONAZOLE 150 MG PO TABS
150.0000 mg | ORAL_TABLET | Freq: Once | ORAL | 0 refills | Status: AC
Start: 2024-01-18 — End: 2024-01-18

## 2024-03-27 ENCOUNTER — Telehealth

## 2024-03-27 DIAGNOSIS — B9689 Other specified bacterial agents as the cause of diseases classified elsewhere: Secondary | ICD-10-CM

## 2024-03-27 DIAGNOSIS — N760 Acute vaginitis: Secondary | ICD-10-CM | POA: Diagnosis not present

## 2024-03-28 MED ORDER — METRONIDAZOLE 0.75 % VA GEL: 1.0000 | Freq: Every day | VAGINAL | 0 refills | Status: DC

## 2024-03-28 NOTE — Progress Notes (Signed)
 I have spent 5 minutes in review of e-visit questionnaire, review and updating patient chart, medical decision making and response to patient.   Elsie Velma Lunger, PA-C

## 2024-03-28 NOTE — Progress Notes (Signed)

## 2024-04-17 ENCOUNTER — Telehealth

## 2024-04-17 DIAGNOSIS — B9689 Other specified bacterial agents as the cause of diseases classified elsewhere: Secondary | ICD-10-CM

## 2024-04-17 DIAGNOSIS — N760 Acute vaginitis: Secondary | ICD-10-CM | POA: Diagnosis not present

## 2024-04-18 MED ORDER — METRONIDAZOLE 500 MG PO TABS
500.0000 mg | ORAL_TABLET | Freq: Two times a day (BID) | ORAL | 0 refills | Status: DC
Start: 2024-04-18 — End: 2024-04-18

## 2024-04-18 MED ORDER — METRONIDAZOLE 0.75 % VA GEL: 1.0000 | Freq: Every day | VAGINAL | 0 refills | Status: AC

## 2024-04-18 NOTE — Progress Notes (Signed)
 E-Visit for Vaginal Symptoms  We are sorry that you are not feeling well. Here is how we plan to help! Based on what you shared with me it looks like you: May have a vaginosis due to bacteria  Vaginosis is an inflammation of the vagina that can result in discharge, itching and pain. The cause is usually a change in the normal balance of vaginal bacteria or an infection. Vaginosis can also result from reduced estrogen levels after menopause.  The most common causes of vaginosis are:   Bacterial vaginosis which results from an overgrowth of one on several organisms that are normally present in your vagina.   Yeast infections which are caused by a naturally occurring fungus called candida.   Vaginal atrophy (atrophic vaginosis) which results from the thinning of the vagina from reduced estrogen levels after menopause.   Trichomoniasis which is caused by a parasite and is commonly transmitted by sexual intercourse.  Factors that increase your risk of developing vaginosis include: Medications, such as antibiotics and steroids Uncontrolled diabetes Use of hygiene products such as bubble bath, vaginal spray or vaginal deodorant Douching Wearing damp or tight-fitting clothing Using an intrauterine device (IUD) for birth control Hormonal changes, such as those associated with pregnancy, birth control pills or menopause Sexual activity Having a sexually transmitted infection  Your treatment plan is Metronidazole or Flagyl 500mg  twice a day for 7 days.  I have electronically sent this prescription into the pharmacy that you have chosen.  Be sure to take all of the medication as directed. Stop taking any medication if you develop a rash, tongue swelling or shortness of breath. Mothers who are breast feeding should consider pumping and discarding their breast milk while on these antibiotics. However, there is no consensus that infant exposure at these doses would be harmful.  Remember that  medication creams can weaken latex condoms. SABRA   HOME CARE:  Good hygiene may prevent some types of vaginosis from recurring and may relieve some symptoms:  Avoid baths, hot tubs and whirlpool spas. Rinse soap from your outer genital area after a shower, and dry the area well to prevent irritation. Don't use scented or harsh soaps, such as those with deodorant or antibacterial action. Avoid irritants. These include scented tampons and pads. Wipe from front to back after using the toilet. Doing so avoids spreading fecal bacteria to your vagina.  Other things that may help prevent vaginosis include:  Don't douche. Your vagina doesn't require cleansing other than normal bathing. Repetitive douching disrupts the normal organisms that reside in the vagina and can actually increase your risk of vaginal infection. Douching won't clear up a vaginal infection. Use a latex condom. Both female and female latex condoms may help you avoid infections spread by sexual contact. Wear cotton underwear. Also wear pantyhose with a cotton crotch. If you feel comfortable without it, skip wearing underwear to bed. Yeast thrives in [employer] Your symptoms should improve in the next day or two.  GET HELP RIGHT AWAY IF:  You have pain in your lower abdomen ( pelvic area or over your ovaries) You develop nausea or vomiting You develop a fever Your discharge changes or worsens You have persistent pain with intercourse You develop shortness of breath, a rapid pulse, or you faint.  These symptoms could be signs of problems or infections that need to be evaluated by a medical provider now.  MAKE SURE YOU   Understand these instructions. Will watch your condition. Will get help right  away if you are not doing well or get worse.  Thank you for choosing an e-visit.  Your e-visit answers were reviewed by a board certified advanced clinical practitioner to complete your personal care plan. Depending upon the  condition, your plan could have included both over the counter or prescription medications.  Please review your pharmacy choice. Make sure the pharmacy is open so you can pick up prescription now. If there is a problem, you may contact your provider through [employer] and have the prescription routed to another pharmacy.  Your safety is important to us . If you have drug allergies check your prescription carefully.   For the next 24 hours you can use MyChart to ask questions about today's visit, request a non-urgent call back, or ask for a work or school excuse. You will get an email in the next two days asking about your experience. I hope that your e-visit has been valuable and will speed your recovery.  I have spent 5 minutes in review of e-visit questionnaire, review and updating patient chart, medical decision making and response to patient.   Barbara Gilbert Dickinson, PA-C

## 2024-04-18 NOTE — Addendum Note (Signed)
 Addended by: VIVIENNE DELON HERO on: 04/18/2024 07:28 AM   Modules accepted: Orders

## 2024-04-30 ENCOUNTER — Telehealth

## 2024-05-01 ENCOUNTER — Telehealth: Admitting: Physician Assistant

## 2024-05-01 DIAGNOSIS — F411 Generalized anxiety disorder: Secondary | ICD-10-CM

## 2024-05-01 DIAGNOSIS — F4310 Post-traumatic stress disorder, unspecified: Secondary | ICD-10-CM | POA: Diagnosis not present

## 2024-05-01 MED ORDER — SERTRALINE HCL 50 MG PO TABS
50.0000 mg | ORAL_TABLET | Freq: Every day | ORAL | 1 refills | Status: AC
Start: 2024-05-01 — End: ?

## 2024-05-01 MED ORDER — HYDROXYZINE PAMOATE 25 MG PO CAPS: 25.0000 mg | ORAL_CAPSULE | Freq: Three times a day (TID) | ORAL | 0 refills | Status: AC | PRN

## 2024-05-01 NOTE — Patient Instructions (Signed)
 Barbara Gilbert, thank you for joining Barbara CHRISTELLA Dickinson, PA-C for today's virtual visit.  While this provider is not your primary care provider (PCP), if your PCP is located in our provider database this encounter information will be shared with them immediately following your visit.   A [place] MyChart account gives you access to today's visit and all your visits, tests, and labs performed at [facility]  click here if you don't have a [place] MyChart account or go to [email or web address]  Consent: (Patient) Barbara Gilbert provided verbal consent for this virtual visit at the beginning of the encounter.  Current Medications:  Current Outpatient Medications:    hydrOXYzine  (VISTARIL ) 25 MG capsule, Take 1 capsule (25 mg total) by mouth every 8 (eight) hours as needed., Disp: 30 capsule, Rfl: 0   sertraline  (ZOLOFT ) 50 MG tablet, Take 1 tablet (50 mg total) by mouth at bedtime. Start 0.5 tablet (25mg ) PO q hs x 1 week; Then increase to 1 tablet (50mg ) PO qhs, Disp: 30 tablet, Rfl: 1   acetaminophen  (TYLENOL ) 500 MG tablet, Take 500 mg by mouth every 6 (six) hours as needed., Disp: , Rfl:    metroNIDAZOLE  (METROGEL ) 0.75 % vaginal gel, Place 1 Applicatorful vaginally at bedtime., Disp: 70 g, Rfl: 0   Medications ordered in this encounter:  Meds ordered this encounter  Medications   sertraline  (ZOLOFT ) 50 MG tablet    Sig: Take 1 tablet (50 mg total) by mouth at bedtime. Start 0.5 tablet (25mg ) PO q hs x 1 week; Then increase to 1 tablet (50mg ) PO qhs    Dispense:  30 tablet    Refill:  1    Supervising Provider:   LAMPTEY, PHILIP O [[id number]]   hydrOXYzine  (VISTARIL ) 25 MG capsule    Sig: Take 1 capsule (25 mg total) by mouth every 8 (eight) hours as needed.    Dispense:  30 capsule    Refill:  0    Supervising Provider:   BLAISE ALEENE KIDD [[id number]]     *If you need refills on other medications prior to your next appointment, please contact your  pharmacy*  Follow-Up: Call back or seek an in-person evaluation if the symptoms worsen or if the condition fails to improve as anticipated.  [facility] [phone]  Other Instructions  Mindfulness Training/Deep Breathing/Guided Meditations: - Consider downloading one of the following apps as they each deal with walking you through these techniques -- Calm, HeadSpace, [place] - There are also a lot of great books on these subjects. Here are a few good ones to consider:           * Mindfulness for Beginners by Thom Needs           * Practicing Mindfulness by Donnice Morones           * Breath WORK by Gene Smithson   National Suicide Prevention Lifeline 223-235-7978 3315526092) OffParking.fi "988"-Mental Health Crisis [facility] of Mental Health 302-042-9660 nimhinfo@nih .gov (e-mail) http://www.maynard.net/  Local Resources: Outpatient:   [facility]:  [phone] [address]   [place], [place]  [address]   [email or web address]  [facility] [address] [place], [place] [address] [phone]  [facility]  Outpatient Behavioral Health at [facility] [address] [employer] [place], [place]  [address] [phone] Local Resources (Inpatient)  [facility] [address]  [place], [place] [address] [phone]  [facility] Medicine Unit and Geriatric Psychiatric Unit   [address]  [place], [place] [address] [phone]   NAMI -[place] [email or web address]  NAMI [facility] [email or web address]  [employer] and Support Resources:  Partners Ending Homelessness DesireBlog.pl  Teacher, music https://www.interactiveresourcecenter.org/  [employer] https://www.greensborourbanministry.org/  [facility] [employer] (adult [employer] shelter) www.opendoorministrieshp.org [address] [place], [place] [address] [phone] [employer] of [employer] and Center of [facility] [phone] [place], [place] [address] [phone]   [facility] [phone] VET ([phone])  [facility] [phone] press 1 Confidential chat-VeteransCrisisLine.net Or Text to ([phone]  [facility] Call [phone] or [phone] www.http://[email or web address]-[email or web address]  Affordable Housing Resources in [place]  nchousingsearch.com   [phone]  [facility] Housing Hotline (920)838-1166 8:30am-5:30pm Caring Services - Vet Safety Net [address] [place], [place]  [address] [phone] Female veterans 18+ with substance abuse issues Eligibility:  By Referral Only  [facility] [address] [place], [place] [address] [phone] Ext. 58 Adult Men & Women Eligibility: Valid ID & Social Engineer, drilling.greensborourbanministry.[facility]  Caring Services - Vet Safety Net [address] [place], [place]  [address] [phone] Female veterans 18+ with substance abuse issues Eligibility:  By Referral Only  [facility] [address] [place], [place]  [address] [phone] Single women 18+ without dependents Open 6pm-8am Eligibility:  Valid ID & Social Security Card Call to check availability  http://westendministries.org/leslieshouse.aspx  [facility] [address] [place], [place]  [address] [phone] Female veterans 18+ with substance abuse/mental health issues Eligibility:  By Referral Only  [facility] [address] [place], [place] [address] [phone] Call to check availability Males  18+ Eligibility: Valid ID & Social Security Card www.odm-hp.[facility] of [employer] [address] [place], [place] [address] [phone] Women 18+ & Families with children Eligibility:  Valid ID & Criminal Background Check https://gomez.info/     [facility] [place] (Care Management): [phone] [facility]: Behavioral Health 24 Hour Phone Line [phone] NAMI Hotline [phone] NAMI   [phone]  2-1-1 Referral Service [facility] - Behavioral Health Services 211 Call 211 or [phone] www.http://stephens-[email or web address]/ A free [employer], 24/7 telephone information and referral service to help link citizens who are seeking help with the community resources they need. In [place], [place], [place] and [place] counties, just dial 211 from your phone.  Mental Health Association in [place] [phone] Support groups for anxiety, depression and bipolar disorder, schizophrenia, family and friends, aftermath of suicide, and mental wellness for Latinos (in [place]).  Mental Health Association in [facility] [phone] Offers support groups, [employer] program offers. Psychological, vocational, educational and other rehabilitation services to those who suffer from mental health illness of the 60 and up (5 days a week/5hours a day), offer out patient services like diagnostic evaluation, comprehensive clinical assessments, individual counseling, group therapy, psycho-educational workshops, referral to other specialists, referral to a psychiatrist for an evaluation for medication, consultation, and outreach/training.  ADS (Alcohol and Drug Services) 6717671100 [phone] Substance Abuse education, prevention and treatment (detox, assessments, intensive outpatient and inpatient counseling and programs).  [facility] [place] [phone], [place] [phone] Support groups for posttraumatic stress, depression, and schizophrenia? as well as day  programs for individuals with severe mental illness.  [facility] [place] [phone]  [facility]-[phone]  [employer]  2673809863 or www.kellinfoundation.org Telehealth platform, Individual counseling across the lifespan for both mental health and substance use, support groups, advocacy, case management, virtual villages, resource coordination.  [phone]  [facility]-FREE-518-496-1657 or [phone]. Provides mental health services to all residents regardless of ability to pay. [address] [place]. [place]. 24  Domestic Violence Crisis Line Family Service of the [employer] for shelter and/or Chief of [employer] [phone] (24/7) Valentin Khat [phone] (24/7)  VA Homeless Hotline (314)[phone]  [facility] [phone] press 1 Confidential chat-VeteransCrisisLine.net Or Text to [phone]  [facility] [address] [place], [place] [address] [phone] Hours: Mon-Fri. 8am-5pm [facility] Mobile Crisis Management Mobile crisis response for mental health, substance abuse or intellectual/developmental disabilities ([phone]  Disclaimer: This resource list is subject to change at any time and is a starting point for resource identification as of 08/06/2021.     Take Care!  Managing Anxiety, Adult After being diagnosed with anxiety, you may be relieved to know why you have felt or behaved a certain way. You may also feel overwhelmed about the treatment ahead and what it will mean for your life. With care and support, you can manage your anxiety. How to manage lifestyle changes Understanding the difference between stress and anxiety Although stress can play a role in anxiety, it is not the same as anxiety. Stress is your body's reaction to life changes and events, both good and bad. Stress is often caused by something  external, such as a deadline, test, or competition. It normally goes away after the event has ended and will last just a few hours. But, stress can be ongoing and can lead to more than just stress. Anxiety is caused by something internal, such as imagining a terrible outcome or worrying that something will go wrong that will greatly upset you. Anxiety often does not go away even after the event is over, and it can become a long-term (chronic) worry. Lowering stress and anxiety Talk with your health care provider or a counselor to learn more about lowering anxiety and stress. They may suggest tension-reduction techniques, such as: Music. Spend time creating or listening to music that you enjoy and that inspires you. Mindfulness-based meditation. Practice being aware of your normal breaths while not trying to control your breathing. It can be done while sitting or walking. Centering prayer. Focus on a word, phrase, or sacred image that means something to you and brings you peace. Deep breathing. Expand your stomach and inhale slowly through your nose. Hold your breath for 3-5 seconds. Then breathe out slowly, letting your stomach muscles relax. Self-talk. Learn to notice and spot thought patterns that lead to anxiety reactions. Change those patterns to thoughts that feel peaceful. Muscle relaxation. Take time to tense muscles and then relax them. Choose a tension-reduction technique that fits your lifestyle and personality. These techniques take time and practice. Set aside 5-15 minutes a day to do them. Specialized therapists can offer counseling and training in these techniques. The training to help with anxiety may be covered by some insurance plans. Other things you can do to manage stress and anxiety include: Keeping a stress diary. This can help you learn what triggers your reaction and then learn ways to manage your response. Thinking about how you react to certain situations. You may not be able  to control everything, but you can control your response. Making time for activities that help you relax and not feeling guilty about  spending your time in this way. Doing visual imagery. This involves imagining or creating mental pictures to help you relax. Practicing yoga. Through yoga poses, you can lower tension and relax.  Medicines Medicines for anxiety include: Antidepressant medicines. These are usually prescribed for long-term daily control. Anti-anxiety medicines. These may be added in severe cases, especially when panic attacks occur. When used together, medicines, psychotherapy, and tension-reduction techniques may be the most effective treatment. Relationships Relationships can play a big part in helping you recover. Spend more time connecting with trusted friends and family members. Think about going to couples counseling if you have a partner, taking family education classes, or going to family therapy. Therapy can help you and others better understand your anxiety. How to recognize changes in your anxiety Everyone responds differently to treatment for anxiety. Recovery from anxiety happens when symptoms lessen and stop interfering with your daily life at home or work. This may mean that you will start to: Have better concentration and focus. Worry will interfere less in your daily thinking. Sleep better. Be less irritable. Have more energy. Have improved memory. Try to recognize when your condition is getting worse. Contact your provider if your symptoms interfere with home or work and you feel like your condition is not improving. Follow these instructions at home: Activity Exercise. Adults should: Exercise for at least 150 minutes each week. The exercise should increase your heart rate and make you sweat (moderate-intensity exercise). Do strengthening exercises at least twice a week. Get the right amount and quality of sleep. Most adults need 7-9 hours of sleep each  night. Lifestyle  Eat a healthy diet that includes plenty of vegetables, fruits, whole grains, low-fat dairy products, and lean protein. Do not eat a lot of foods that are high in fats, added sugars, or salt (sodium). Make choices that simplify your life. Do not use any products that contain nicotine or tobacco. These products include cigarettes, chewing tobacco, and vaping devices, such as e-cigarettes. If you need help quitting, ask your provider. Avoid caffeine, alcohol, and certain over-the-counter cold medicines. These may make you feel worse. Ask your pharmacist which medicines to avoid. General instructions Take over-the-counter and prescription medicines only as told by your provider. Keep all follow-up visits. This is to make sure you are managing your anxiety well or if you need more support. Where to find support You can get help and support from: Self-help groups. Online and [employer]. A trusted spiritual leader. Couples counseling. Family education classes. Family therapy. Where to find more information You may find that joining a support group helps you deal with your anxiety. The following sources can help you find counselors or support groups near you: Mental Health America: mentalhealthamerica.net Anxiety and Depression Association of [place] (ADAA): adaa.org The [employer] on Mental Illness (NAMI): nami.org Contact a health care provider if: You have a hard time staying focused or finishing tasks. You spend many hours a day feeling worried about everyday life. You are very tired because you cannot stop worrying. You start to have headaches or often feel tense. You have chronic nausea or diarrhea. Get help right away if: Your heart feels like it is racing. You have shortness of breath. You have thoughts of hurting yourself or others. Get help right away if you feel like you may hurt yourself or others, or have thoughts about taking your own life. Go  to your nearest emergency room or: Call 911. Call the National Suicide Prevention Lifeline at (825)115-9583 or 988.  This is open 24 hours a day. Text the Crisis Text Line at 724-377-7531. This information is not intended to replace advice given to you by your health care provider. Make sure you discuss any questions you have with your health care provider. Document Revised: 05/03/2022 Document Reviewed: 11/15/2020 Elsevier Patient Education  2024 Elsevier Inc.    If you have been instructed to have an in-person evaluation today at a local Urgent Care facility, please use the link below. It will take you to a list of all of our available [facility], including address, phone number and hours of operation. Please do not delay care.  [facility]  If you or a family member do not have a primary care provider, use the link below to schedule a visit and establish care. When you choose a [facility] primary care physician or advanced practice provider, you gain a long-term partner in health. Find a Primary Care Provider  Learn more about [facility] in-office and virtual care options: [facility] - Get Care Now

## 2024-05-01 NOTE — Progress Notes (Signed)
 Virtual Visit Consent   Barbara Gilbert, you are scheduled for a virtual visit with a [facility] provider today. Just as with appointments in the office, your consent must be obtained to participate. Your consent will be active for this visit and any virtual visit you may have with one of our providers in the next 365 days. If you have a MyChart account, a copy of this consent can be sent to you electronically.  As this is a virtual visit, video technology does not allow for your provider to perform a traditional examination. This may limit your provider's ability to fully assess your condition. If your provider identifies any concerns that need to be evaluated in person or the need to arrange testing (such as labs, EKG, etc.), we will make arrangements to do so. Although advances in technology are sophisticated, we cannot ensure that it will always work on either your end or our end. If the connection with a video visit is poor, the visit may have to be switched to a telephone visit. With either a video or telephone visit, we are not always able to ensure that we have a secure connection.  By engaging in this virtual visit, you consent to the provision of healthcare and authorize for your insurance to be billed (if applicable) for the services provided during this visit. Depending on your insurance coverage, you may receive a charge related to this service.  I need to obtain your verbal consent now. Are you willing to proceed with your visit today? Cruz Seldon has provided verbal consent on 05/01/2024 for a virtual visit (video or telephone). Delon CHRISTELLA Dickinson, PA-C  Date: 05/01/2024 11:57 AM   Virtual Visit via Video Note   IDelon CHRISTELLA Dickinson, connected with  Izora Benn  ([id number], Mar 21, 1986) on 05/01/24 at 10:45 AM EDT by a video-enabled telemedicine application and verified that I am speaking with the correct person using two identifiers.  Location: Patient: Virtual Visit Location Patient:  Home Provider: Virtual Visit Location Provider: Home Office   I discussed the limitations of evaluation and management by telemedicine and the availability of in person appointments. The patient expressed understanding and agreed to proceed.    History of Present Illness: Barbara Gilbert is a 38 y.o. who identifies as a female who was assigned female at birth, and is being seen today for acute exacerbation of anxiety and PTSD.  Does have long-standing remote history. Previously had been on medication and attended therapy. Coping mechanisms helped enough that she was able to stop medications and has been off for a few years.   Recently, her father passed and she has had an exacerbation of the symptoms. She has started to have worsening sleep quality, frequent awakenings, awakening in cold sweats, increased anhedonia, and slight agoraphobia.  She is interested in re-establishing with a therapist and potential short-term management with medications again.   She denies SI or HI.   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 Start 0.5 tablet (25mg ) PO q hs x 1 week; Then increase to 1 tablet (50mg ) PO qhs, Disp: 30 tablet, Rfl: 1  acetaminophen  (TYLENOL ) 500 MG tablet, Take 500 mg by mouth every 6 (six) hours as needed., Disp: , Rfl:    metroNIDAZOLE  (METROGEL ) 0.75 % vaginal gel, Place 1 Applicatorful vaginally at bedtime., Disp: 70 g, Rfl: 0  Observations/Objective: Patient is well-developed, well-nourished in no acute distress.  Resting comfortably at home.  Head is normocephalic, atraumatic.  No labored breathing.  Speech is clear  and coherent with logical content.  Patient is alert and oriented at baseline.    Assessment and Plan: 1. PTSD (post-traumatic stress disorder) (Primary) - sertraline  (ZOLOFT ) 50 MG tablet; Take 1 tablet (50 mg total) by mouth at bedtime. Start 0.5 tablet (25mg ) PO q hs x 1 week; Then increase to 1 tablet (50mg ) PO qhs  Dispense: 30 tablet; Refill: 1 - hydrOXYzine  (VISTARIL ) 25 MG capsule; Take 1 capsule (25 mg total) by mouth every 8 (eight) hours as needed.  Dispense: 30 capsule; Refill: 0  2. GAD (generalized anxiety disorder) - sertraline  (ZOLOFT ) 50 MG tablet; Take 1 tablet (50 mg total) by mouth at bedtime. Start 0.5 tablet (25mg ) PO q hs x 1 week; Then increase to 1 tablet (50mg ) PO qhs  Dispense: 30 tablet; Refill: 1 - hydrOXYzine  (VISTARIL ) 25 MG capsule; Take 1 capsule (25 mg total) by mouth every 8 (eight) hours as needed.  Dispense: 30 capsule; Refill: 0  - Acute exacerbation - Add Sertraline  for daily use - Add Hydroxyzine  for as needed use twice daily and one standing dose at bedtime - Follow up with PCP in 3-4 weeks for medication management - Seek immediate in-person evaluation if worsening or develops Suicidal thoughts, can also call 9-8-8 if needed   Follow Up Instructions: I discussed the assessment and treatment plan with the patient. The patient was provided an opportunity to ask questions and all were answered. The patient agreed with the plan and demonstrated an understanding of the instructions.  A copy of instructions were sent to the patient via MyChart unless otherwise noted below.    The patient was advised to call back or seek an in-person evaluation if the symptoms worsen or if the condition fails to improve as anticipated.    Delon CHRISTELLA Dickinson, PA-C

## 2024-05-17 ENCOUNTER — Other Ambulatory Visit: Payer: Self-pay

## 2024-05-17 ENCOUNTER — Emergency Department (HOSPITAL_BASED_OUTPATIENT_CLINIC_OR_DEPARTMENT_OTHER)
Admission: EM | Admit: 2024-05-17 | Discharge: 2024-05-17 | Disposition: A | Discharge status: Home / Self Care | Attending: Emergency Medicine | Admitting: Emergency Medicine

## 2024-05-17 DIAGNOSIS — R413 Other amnesia: Secondary | ICD-10-CM | POA: Diagnosis present

## 2024-05-17 LAB — URINE DRUG SCREEN
Amphetamines: NEGATIVE
Barbiturates: NEGATIVE
Benzodiazepines: NEGATIVE
Cocaine: NEGATIVE
Fentanyl: NEGATIVE
Methadone Scn, Ur: NEGATIVE
Opiates: NEGATIVE
Tetrahydrocannabinol: POSITIVE — AB

## 2024-05-17 LAB — ETHANOL: Alcohol, Ethyl (B): <15 mg/dL (ref <15)
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
## 2024-05-17 NOTE — Discharge Instructions (Signed)
 As we discussed, you can look for the GHB screening results in MyChart but anticipate, with the time elapsed since onset of symptoms, that it will result as negative.   Return to the ED with any new or concerning symptoms at any time.
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
## 2024-05-24 ENCOUNTER — Telehealth (HOSPITAL_COMMUNITY): Payer: Self-pay

## 2024-05-24 NOTE — Telephone Encounter (Signed)
 Pt. Called and left a message for a call back in reference to a lab.  Returned call, no answer, message left for  a return call and the lab that she inquired about is still in process.

## 2024-05-26 LAB — GHB SCREEN, URINE: GHB, Urine: NEGATIVE

## 2024-06-19 ENCOUNTER — Telehealth (HOSPITAL_COMMUNITY): Payer: Self-pay

## 2024-06-19 NOTE — Telephone Encounter (Signed)
 Pt. Called an left a VM for a return call concerning a lab results that was collected during her ED visit on 05/17/2024.  Contacted pt and 2 id to verify, informed her of Lab  results and encouraged her to contact our  Lab at [phone] and also the number listed on the lab results for [facility].  Pt. Verbalized understanding. I informed her to contact me if she has any other questions.

## 2024-06-26 ENCOUNTER — Telehealth: Admitting: Family Medicine

## 2024-06-26 DIAGNOSIS — N760 Acute vaginitis: Secondary | ICD-10-CM

## 2024-06-26 DIAGNOSIS — B9689 Other specified bacterial agents as the cause of diseases classified elsewhere: Secondary | ICD-10-CM | POA: Diagnosis not present

## 2024-06-26 DIAGNOSIS — B3731 Acute candidiasis of vulva and vagina: Secondary | ICD-10-CM

## 2024-06-26 MED ORDER — METRONIDAZOLE 0.75 % VA GEL: 1.0000 | Freq: Two times a day (BID) | VAGINAL | 0 refills | Status: DC

## 2024-06-26 MED ORDER — FLUCONAZOLE 150 MG PO TABS
150.0000 mg | ORAL_TABLET | Freq: Every day | ORAL | 0 refills | Status: AC
Start: 2024-06-26 — End: 2024-06-27

## 2024-06-26 NOTE — Progress Notes (Signed)

## 2024-08-27 ENCOUNTER — Telehealth: Admitting: Family Medicine

## 2024-08-27 DIAGNOSIS — B3731 Acute candidiasis of vulva and vagina: Secondary | ICD-10-CM | POA: Diagnosis not present

## 2024-08-27 DIAGNOSIS — B9689 Other specified bacterial agents as the cause of diseases classified elsewhere: Secondary | ICD-10-CM

## 2024-08-27 DIAGNOSIS — N760 Acute vaginitis: Secondary | ICD-10-CM | POA: Diagnosis not present

## 2024-08-27 MED ORDER — FLUCONAZOLE 150 MG PO TABS: 150.0000 mg | ORAL_TABLET | Freq: Every day | ORAL | 0 refills | Status: AC

## 2024-08-27 MED ORDER — METRONIDAZOLE 0.75 % VA GEL: 1.0000 | Freq: Two times a day (BID) | VAGINAL | 0 refills | Status: AC

## 2024-08-27 NOTE — Progress Notes (Signed)
 We are sorry that you are not feeling well. Here is how we plan to help! Based on what you shared with me it looks like you: May have a vaginosis due to bacteria and yeast.  Vaginosis is an inflammation of the vagina that can result in discharge, itching and pain. The cause is usually a change in the normal balance of vaginal bacteria or an infection. Vaginosis can also result from reduced estrogen levels after menopause.  The most common causes of vaginosis are:   Bacterial vaginosis which results from an overgrowth of one on several organisms that are normally present in your vagina.   Yeast infections which are caused by a naturally occurring fungus called candida.   Vaginal atrophy (atrophic vaginosis) which results from the thinning of the vagina from reduced estrogen levels after menopause.   Trichomoniasis which is caused by a parasite and is commonly transmitted by sexual intercourse.  Factors that increase your risk of developing vaginosis include: Medications, such as antibiotics and steroids Uncontrolled diabetes Use of hygiene products such as bubble bath, vaginal spray or vaginal deodorant Douching Wearing damp or tight-fitting clothing Using an intrauterine device (IUD) for birth control Hormonal changes, such as those associated with pregnancy, birth control pills or menopause Sexual activity Having a sexually transmitted infection  Your treatment plan is metronidazole  gel and diflucan .   Be sure to take all of the medication as directed. Stop taking any medication if you develop a rash, tongue swelling or shortness of breath. Mothers who are breast feeding should consider pumping and discarding their breast milk while on these antibiotics. However, there is no consensus that infant exposure at these doses would be harmful.  Remember that medication creams can weaken latex condoms.   HOME CARE:  Good hygiene may prevent some types of vaginosis from recurring and may  relieve some symptoms:  Avoid baths, hot tubs and whirlpool spas. Rinse soap from your outer genital area after a shower, and dry the area well to prevent irritation. Don't use scented or harsh soaps, such as those with deodorant or antibacterial action. Avoid irritants. These include scented tampons and pads. Wipe from front to back after using the toilet. Doing so avoids spreading fecal bacteria to your vagina.  Other things that may help prevent vaginosis include:  Don't douche. Your vagina doesn't require cleansing other than normal bathing. Repetitive douching disrupts the normal organisms that reside in the vagina and can actually increase your risk of vaginal infection. Douching won't clear up a vaginal infection. Use a latex condom. Both female and female latex condoms may help you avoid infections spread by sexual contact. Wear cotton underwear. Also wear pantyhose with a cotton crotch. If you feel comfortable without it, skip wearing underwear to bed. Yeast thrives in [place] Your symptoms should improve in the next day or two.  GET HELP RIGHT AWAY IF:  You have pain in your lower abdomen ( pelvic area or over your ovaries) You develop nausea or vomiting You develop a fever Your discharge changes or worsens You have persistent pain with intercourse You develop shortness of breath, a rapid pulse, or you faint.  These symptoms could be signs of problems or infections that need to be evaluated by a medical provider now.  MAKE SURE YOU   Understand these instructions. Will watch your condition. Will get help right away if you are not doing well or get worse.  Your e-visit answers were reviewed by a board certified advanced clinical practitioner  to complete your personal care plan. Depending upon the condition, your plan could have included both over the counter or prescription medications. Please review your pharmacy choice to make sure that you have choses a pharmacy  that is open for you to pick up any needed prescription, Your safety is important to us . If you have drug allergies check your prescription carefully.   You can use MyChart to ask questions about todays visit, request a non-urgent call back, or ask for a work or school excuse for 24 hours related to this e-Visit. If it has been greater than 24 hours you will need to follow up with your provider, or enter a new e-Visit to address those concerns. You will get a MyChart message within the next two days asking about your experience. I hope that your e-visit has been valuable and will speed your recovery.  I have spent 5 minutes in review of e-visit questionnaire, review and updating patient chart, medical decision making and response to patient.   Kylia Grajales, FNP
